# Patient Record
Sex: Female | Born: 1960 | Race: Black or African American | Hispanic: No | Marital: Married | Smoking: Never smoker
Health system: Southern US, Community
[De-identification: ages and names within clinical notes are randomized; demographics above are authoritative.]

## PROBLEM LIST (undated history)

## (undated) DIAGNOSIS — E785 Hyperlipidemia, unspecified: Secondary | ICD-10-CM

## (undated) DIAGNOSIS — I10 Essential (primary) hypertension: Secondary | ICD-10-CM

## (undated) HISTORY — DX: Essential (primary) hypertension: I10

---

## 1998-08-23 ENCOUNTER — Emergency Department (HOSPITAL_COMMUNITY): Admission: EM | Admit: 1998-08-23 | Discharge: 1998-08-23 | Payer: Self-pay | Admitting: Emergency Medicine

## 1998-08-24 ENCOUNTER — Encounter: Payer: Self-pay | Admitting: *Deleted

## 2012-12-01 ENCOUNTER — Telehealth (HOSPITAL_COMMUNITY): Payer: Self-pay | Admitting: *Deleted

## 2012-12-01 NOTE — Telephone Encounter (Signed)
Telephoned patient at home # and left message to return call to [facility] 

## 2012-12-04 ENCOUNTER — Other Ambulatory Visit: Payer: Self-pay | Admitting: Obstetrics and Gynecology

## 2012-12-04 DIAGNOSIS — Z1231 Encounter for screening mammogram for malignant neoplasm of breast: Secondary | ICD-10-CM

## 2012-12-07 ENCOUNTER — Encounter (HOSPITAL_COMMUNITY): Payer: Self-pay | Admitting: *Deleted

## 2012-12-26 ENCOUNTER — Ambulatory Visit (HOSPITAL_COMMUNITY): Payer: Self-pay | Attending: Obstetrics and Gynecology

## 2019-09-05 ENCOUNTER — Other Ambulatory Visit: Payer: Self-pay

## 2019-09-05 ENCOUNTER — Emergency Department (HOSPITAL_COMMUNITY): Payer: Self-pay

## 2019-09-05 ENCOUNTER — Inpatient Hospital Stay (HOSPITAL_COMMUNITY)
Admission: EM | Admit: 2019-09-05 | Discharge: 2019-09-09 | DRG: 683 | Disposition: A | Discharge status: Home / Self Care | Payer: Self-pay | Attending: Internal Medicine | Admitting: Internal Medicine

## 2019-09-05 ENCOUNTER — Encounter (HOSPITAL_COMMUNITY): Payer: Self-pay | Admitting: Emergency Medicine

## 2019-09-05 DIAGNOSIS — Z20828 Contact with and (suspected) exposure to other viral communicable diseases: Secondary | ICD-10-CM | POA: Diagnosis present

## 2019-09-05 DIAGNOSIS — A020 Salmonella enteritis: Secondary | ICD-10-CM | POA: Diagnosis present

## 2019-09-05 DIAGNOSIS — R739 Hyperglycemia, unspecified: Secondary | ICD-10-CM | POA: Diagnosis present

## 2019-09-05 DIAGNOSIS — R197 Diarrhea, unspecified: Secondary | ICD-10-CM

## 2019-09-05 DIAGNOSIS — E869 Volume depletion, unspecified: Secondary | ICD-10-CM

## 2019-09-05 DIAGNOSIS — A040 Enteropathogenic Escherichia coli infection: Secondary | ICD-10-CM | POA: Diagnosis present

## 2019-09-05 DIAGNOSIS — E872 Acidosis, unspecified: Secondary | ICD-10-CM

## 2019-09-05 DIAGNOSIS — E860 Dehydration: Secondary | ICD-10-CM | POA: Diagnosis present

## 2019-09-05 DIAGNOSIS — I129 Hypertensive chronic kidney disease with stage 1 through stage 4 chronic kidney disease, or unspecified chronic kidney disease: Secondary | ICD-10-CM | POA: Diagnosis present

## 2019-09-05 DIAGNOSIS — N189 Chronic kidney disease, unspecified: Secondary | ICD-10-CM | POA: Diagnosis present

## 2019-09-05 DIAGNOSIS — E876 Hypokalemia: Secondary | ICD-10-CM | POA: Diagnosis present

## 2019-09-05 DIAGNOSIS — N179 Acute kidney failure, unspecified: Principal | ICD-10-CM | POA: Diagnosis present

## 2019-09-05 LAB — URINALYSIS, ROUTINE W REFLEX MICROSCOPIC
Bilirubin Urine: NEGATIVE
Glucose, UA: NEGATIVE mg/dL
Hgb urine dipstick: NEGATIVE
Ketones, ur: NEGATIVE mg/dL
Leukocytes,Ua: NEGATIVE
Nitrite: NEGATIVE
Protein, ur: 30 mg/dL — AB
Specific Gravity, Urine: 1.013 (ref 1.005–1.030)
pH: 5 (ref 5.0–8.0)

## 2019-09-05 LAB — COMPREHENSIVE METABOLIC PANEL
ALT: 16 U/L (ref 0–44)
AST: 26 U/L (ref 15–41)
Albumin: 4.2 g/dL (ref 3.5–5.0)
Alkaline Phosphatase: 55 U/L (ref 38–126)
Anion gap: 22 — ABNORMAL HIGH (ref 5–15)
BUN: 66 mg/dL — ABNORMAL HIGH (ref 6–20)
CO2: 12 mmol/L — ABNORMAL LOW (ref 22–32)
Calcium: 8.9 mg/dL (ref 8.9–10.3)
Chloride: 101 mmol/L (ref 98–111)
Creatinine, Ser: 5 mg/dL — ABNORMAL HIGH (ref 0.44–1.00)
GFR calc Af Amer: 10 mL/min — ABNORMAL LOW (ref >60)
GFR calc non Af Amer: 9 mL/min — ABNORMAL LOW (ref >60)
Glucose, Bld: 127 mg/dL — ABNORMAL HIGH (ref 70–99)
Potassium: 2.9 mmol/L — ABNORMAL LOW (ref 3.5–5.1)
Sodium: 135 mmol/L (ref 135–145)
Total Bilirubin: 0.7 mg/dL (ref 0.3–1.2)
Total Protein: 9.2 g/dL — ABNORMAL HIGH (ref 6.5–8.1)

## 2019-09-05 LAB — PHOSPHORUS: Phosphorus: 9 mg/dL — ABNORMAL HIGH (ref 2.5–4.6)

## 2019-09-05 LAB — BLOOD GAS, VENOUS
Acid-base deficit: 11.5 mmol/L — ABNORMAL HIGH (ref 0.0–2.0)
Bicarbonate: 14.8 mmol/L — ABNORMAL LOW (ref 20.0–28.0)
O2 Saturation: 42.6 %
Patient temperature: 98.6
pCO2, Ven: 35.8 mmHg — ABNORMAL LOW (ref 44.0–60.0)
pH, Ven: 7.241 — ABNORMAL LOW (ref 7.250–7.430)

## 2019-09-05 LAB — MAGNESIUM: Magnesium: 2.3 mg/dL (ref 1.7–2.4)

## 2019-09-05 LAB — LACTIC ACID, PLASMA: Lactic Acid, Venous: 1 mmol/L (ref 0.5–1.9)

## 2019-09-05 LAB — CBC
HCT: 44.8 % (ref 36.0–46.0)
Hemoglobin: 14.8 g/dL (ref 12.0–15.0)
MCH: 25.6 pg — ABNORMAL LOW (ref 26.0–34.0)
MCHC: 33 g/dL (ref 30.0–36.0)
MCV: 77.5 fL — ABNORMAL LOW (ref 80.0–100.0)
Platelets: 253 10*3/uL (ref 150–400)
RBC: 5.78 MIL/uL — ABNORMAL HIGH (ref 3.87–5.11)
RDW: 13.8 % (ref 11.5–15.5)
WBC: 8.2 10*3/uL (ref 4.0–10.5)
nRBC: 0 % (ref 0.0–0.2)

## 2019-09-05 LAB — I-STAT BETA HCG BLOOD, ED (MC, WL, AP ONLY): I-stat hCG, quantitative: <5 m[IU]/mL (ref <5)

## 2019-09-05 LAB — POC SARS CORONAVIRUS 2 AG -  ED: SARS Coronavirus 2 Ag: NEGATIVE

## 2019-09-05 LAB — LIPASE, BLOOD: Lipase: 215 U/L — ABNORMAL HIGH (ref 11–51)

## 2019-09-05 MED ORDER — SODIUM CHLORIDE 0.9 % IV BOLUS
1000.0000 mL | Freq: Once | INTRAVENOUS | Status: AC
  Administered 2019-09-05: 1000 mL via INTRAVENOUS

## 2019-09-05 MED ORDER — POTASSIUM CHLORIDE 10 MEQ/100ML IV SOLN
10.0000 meq | INTRAVENOUS | Status: AC
  Administered 2019-09-05 – 2019-09-06 (×2): 10 meq via INTRAVENOUS
  Filled 2019-09-05 (×2): qty 100

## 2019-09-05 MED ORDER — SODIUM CHLORIDE 0.9% FLUSH: 3.0000 mL | Freq: Once | INTRAVENOUS | Status: DC

## 2019-09-05 MED ORDER — MAGNESIUM SULFATE 2 GM/50ML IV SOLN
2.0000 g | Freq: Once | INTRAVENOUS | Status: AC
  Administered 2019-09-06: 2 g via INTRAVENOUS
  Filled 2019-09-05: qty 50

## 2019-09-05 MED ORDER — LOPERAMIDE HCL 2 MG PO CAPS
4.0000 mg | ORAL_CAPSULE | Freq: Once | ORAL | Status: AC
  Administered 2019-09-05: 4 mg via ORAL
  Filled 2019-09-05: qty 2

## 2019-09-05 MED ORDER — SODIUM BICARBONATE-DEXTROSE 150-5 MEQ/L-% IV SOLN
150.0000 meq | INTRAVENOUS | Status: DC
  Administered 2019-09-06 – 2019-09-07 (×4): 150 meq via INTRAVENOUS
  Filled 2019-09-05 (×6): qty 1000

## 2019-09-05 MED ORDER — POTASSIUM CHLORIDE CRYS ER 20 MEQ PO TBCR
40.0000 meq | EXTENDED_RELEASE_TABLET | Freq: Once | ORAL | Status: AC
  Administered 2019-09-06: 40 meq via ORAL
  Filled 2019-09-05: qty 2

## 2019-09-05 NOTE — ED Provider Notes (Signed)
[facility] DEPT Provider Note   CSN: [id number] Arrival date & time: 09/05/19  2119     History   Chief Complaint Chief Complaint  Patient presents with  . Diarrhea    HPI Rebecca Huber is a 58 y.o. female.     Patient to ED with complaint of diarrhea for the past 4 days. She denies abdominal pain or cramping. No nausea, vomiting or fever. She states she has a loose, watery, nonbloody stool with any attempt at eating or drinking. No new medications, dietary changes, or recent antibiotics. She reports no change in the amount she is urinating. She feels generally weak and "drained".   The history is provided by the patient. No language interpreter was used.  Diarrhea Associated symptoms: no abdominal pain, no chills, no fever and no vomiting     History reviewed. No pertinent past medical history.  There are no active problems to display for this patient.    OB History   No obstetric history on file.      Home Medications    Prior to Admission medications   Not on File    Family History No family history on file.  Social History Social History   Tobacco Use  . Smoking status: Not on file  Substance Use Topics  . Alcohol use: Not on file  . Drug use: Not on file     Allergies   Patient has no known allergies.   Review of Systems Review of Systems  Constitutional: Negative for chills and fever.  HENT: Negative.   Respiratory: Negative.  Negative for shortness of breath.   Cardiovascular: Negative.  Negative for chest pain.  Gastrointestinal: Positive for diarrhea. Negative for abdominal pain, nausea and vomiting.  Genitourinary: Negative for decreased urine volume and dysuria.  Musculoskeletal: Negative.   Skin: Negative.   Neurological: Positive for weakness. Negative for syncope.     Physical Exam Updated Vital Signs BP (!) 175/97 (BP Location: Left Arm)   Pulse (!) 102   Temp 98.7 F (37.1 C) (Oral)    Resp 18   SpO2 98%   Physical Exam Constitutional:      Appearance: She is well-developed.  HENT:     Head: Normocephalic.     Mouth/Throat:     Mouth: Mucous membranes are dry.  Neck:     Musculoskeletal: Normal range of motion and neck supple.  Cardiovascular:     Rate and Rhythm: Normal rate and regular rhythm.  Pulmonary:     Effort: Pulmonary effort is normal.     Breath sounds: Normal breath sounds. No wheezing, rhonchi or rales.  Abdominal:     General: Bowel sounds are normal.     Palpations: Abdomen is soft.     Tenderness: There is no abdominal tenderness. There is no guarding or rebound.  Musculoskeletal: Normal range of motion.  Skin:    General: Skin is warm and dry.     Findings: No rash.  Neurological:     Mental Status: She is alert and oriented to person, place, and time.      ED Treatments / Results  Labs (all labs ordered are listed, but only abnormal results are displayed) Labs Reviewed  CBC - Abnormal; Notable for the following components:      Result Value   RBC 5.78 (*)    MCV 77.5 (*)    MCH 25.6 (*)    All other components within normal limits  URINALYSIS, ROUTINE W  REFLEX MICROSCOPIC - Abnormal; Notable for the following components:   APPearance HAZY (*)    Protein, ur 30 (*)    Bacteria, UA RARE (*)    All other components within normal limits  LIPASE, BLOOD  COMPREHENSIVE METABOLIC PANEL  I-STAT BETA HCG BLOOD, ED ([facility], [facility], [facility] ONLY)  I-STAT BETA HCG BLOOD, ED ([facility], [facility], [facility] ONLY)    EKG None  Radiology No results found.  Procedures Procedures (including critical care time)  Medications Ordered in ED Medications  sodium chloride 0.9 % bolus 1,000 mL (has no administration in time range)  loperamide (IMODIUM) capsule 4 mg (has no administration in time range)     Initial Impression / Assessment and Plan / ED Course  I have reviewed the triage vital signs and the nursing notes.  Pertinent labs & imaging results that were  available during my care of the patient were reviewed by me and considered in my medical decision making (see chart for details).        Patient to ED with complaint of diarrhea x 4 days. She reports BM with any attempt at eating or drinking. Now feeling generally weak. No pain or fever.   Labs reviewed. She has a significant metabolic acidosis, AKI with Cr 5.0, lipase of 215. Normal LFT's. There is no abdominal pain/tenderness on initial or repeat exam. No N, V. Potassium is low as well at 2.9. VBG c/w metabolic acidosis.   Unclear as to cause of metabolic derangement or elevated lipase. Will require admission for further evaluation. Per admitting MD, Mag, Phos., CT scan ordered. Patient updated on lab results and plan to admit.     Final Clinical Impressions(s) / ED Diagnoses   Final diagnoses:  None   1. Metabolic acidosis 2. Hypokalemia 3. AKI 4. Diarrhea  ED Discharge Orders    None       Elpidio Anis, PA-C 09/06/19 0010    Pricilla Loveless, MD 09/07/19 2251

## 2019-09-05 NOTE — ED Notes (Signed)
Urinalysis sent to lab with culture tube

## 2019-09-05 NOTE — ED Notes (Signed)
Date and time results received: 09/05/19 11:19 PM  (use smartphrase ".now" to insert current time)  Test: PO2 Critical Value: Less than reportable range  Name of Provider Notified: Charlann Lange, PA  Orders Received? Or Actions Taken?: none

## 2019-09-05 NOTE — H&P (Signed)
History and Physical  Rebecca Huber:[id number] DOB: 04-27-1961 DOA: 09/05/2019  Referring physician: ER provider PCP: Patient, No Pcp Per  Outpatient Specialists: None Patient coming from: Home  Chief Complaint: Diarrhea for 3 days  HPI: Patient is a 58 year old African American female with no documented past medical history.  Apparently, patient has not seen a physician for over 20 years.  Patient presents with 3-day history of severe diarrhea.  According to the patient, she had diarrhea stool every time she had an oral intake.  The stool is said to be watery and nonbloody.  Stool is possibly mucoid.  No associated fever or chills, no associated crampy abdominal pain.  No sick contacts.  Patient ate at discharge about 5 days prior to onset of symptoms, otherwise, no history of eating outside of home.  Patient lives alone.  The diarrhea has improved to some extent.  Patient reported 5 episodes of diarrhea stool today.  Patient had nausea and vomiting initially (the day of onset of symptoms) but none for now.  No headache, no neck pain, no chest pain, no shortness of breath, no URI symptoms and no urinary symptoms.  Blood pressure of 175/97 mmHg was documented in presentation.  Patient denies prior history of hypertension.  Pertinent lab work revealed potassium of 2.9, BUN of 66, serum creatinine of 5, CO2 of 12, hemoglobin of 14.8 g/dL but patient is likely hemoconcentrated, blood sugar is 127 and lactic acid is 1.  Specific gravity of 1.013 with urine protein of 30.  Hyaline casts was present.  Hospitalist team has been asked to admit patient for further assessment and management.  ED Course: On presentation to the hospital, temperature was 98.7, blood pressure 175/97, heart rate of 102, respiratory rate of 18 and O2 sat of 98%.  Pertinent labs as documented above.  Patient has been volume resuscitated with 1 L of normal saline.  Potassium is being repleted.  Basic lab work has been sent.   Hospitalist team has been asked to admit patient for further assessment and management.  Pertinent labs: Please see above.  Lipase is 215.   Imaging: independently reviewed.  CT scan of the abdomen and pelvis without contrast revealed normal pancreas, findings suggestive of enteritis, diverticulosis without diverticulitis and hypodense lesion within the posterior aspect of the left lobe of the liver suggestive of possible cyst versus hemangioma.  Review of Systems:  Negative for fever, visual changes, sore throat, rash, new muscle aches, chest pain, SOB, dysuria, bleeding.  History reviewed. No pertinent past medical history.    has no history on file for tobacco, alcohol, and drug.  No Known Allergies  No family history on file.   Prior to Admission medications   Not on File    Physical Exam: Vitals:   09/05/19 2126  BP: (!) 175/97  Pulse: (!) 102  Resp: 18  Temp: 98.7 F (37.1 C)  TempSrc: Oral  SpO2: 98%   Constitutional:  . Appears calm and comfortable. Eyes:  . No pallor. No jaundice.  ENMT:  . external ears, nose appear normal.  Dry buccal mucosa. Neck:  . Neck is supple. No JVD Respiratory:  . CTA bilaterally, no w/r/r.  . Respiratory effort normal. No retractions or accessory muscle use Cardiovascular:  . S1S2 . No LE extremity edema   Abdomen:  . Abdomen is obese, soft and non tender. Organs are difficult to assess. Neurologic:  . Awake and alert. . Moves all limbs.  Wt Readings from Last 3  Encounters:  No data found for Wt    I have personally reviewed following labs and imaging studies  Labs on Admission:  CBC: Recent Labs  Lab 09/05/19 2142  WBC 8.2  HGB 14.8  HCT 44.8  MCV 77.5*  PLT 253   Basic Metabolic Panel: Recent Labs  Lab 09/05/19 2142  NA 135  K 2.9*  CL 101  CO2 12*  GLUCOSE 127*  BUN 66*  CREATININE 5.00*  CALCIUM 8.9   Liver Function Tests: Recent Labs  Lab 09/05/19 2142  AST 26  ALT 16  ALKPHOS 55   BILITOT 0.7  PROT 9.2*  ALBUMIN 4.2   Recent Labs  Lab 09/05/19 2142  LIPASE 215*   No results for input(s): AMMONIA in the last 168 hours. Coagulation Profile: No results for input(s): INR, PROTIME in the last 168 hours. Cardiac Enzymes: No results for input(s): CKTOTAL, CKMB, CKMBINDEX, TROPONINI in the last 168 hours. BNP (last 3 results) No results for input(s): PROBNP in the last 8760 hours. HbA1C: No results for input(s): HGBA1C in the last 72 hours. CBG: No results for input(s): GLUCAP in the last 168 hours. Lipid Profile: No results for input(s): CHOL, HDL, LDLCALC, TRIG, CHOLHDL, LDLDIRECT in the last 72 hours. Thyroid Function Tests: No results for input(s): TSH, T4TOTAL, FREET4, T3FREE, THYROIDAB in the last 72 hours. Anemia Panel: No results for input(s): VITAMINB12, FOLATE, FERRITIN, TIBC, IRON, RETICCTPCT in the last 72 hours. Urine analysis:    Component Value Date/Time   COLORURINE YELLOW 09/05/2019 2142   APPEARANCEUR HAZY (A) 09/05/2019 2142   LABSPEC 1.013 09/05/2019 2142   PHURINE 5.0 09/05/2019 2142   GLUCOSEU NEGATIVE 09/05/2019 2142   HGBUR NEGATIVE 09/05/2019 2142   BILIRUBINUR NEGATIVE 09/05/2019 2142   KETONESUR NEGATIVE 09/05/2019 2142   PROTEINUR 30 (A) 09/05/2019 2142   NITRITE NEGATIVE 09/05/2019 2142   LEUKOCYTESUR NEGATIVE 09/05/2019 2142   Sepsis Labs: @LABRCNTIP (procalcitonin:4,lacticidven:4) )No results found for this or any previous visit (from the past 240 hour(s)).    Radiological Exams on Admission: No results found.   Active Problems:   AKI (acute kidney injury) (HCC)   Assessment/Plan Acute kidney injury versus acute kidney injury on chronic kidney disease: -Suspect patient has previously on chronic kidney disease, stage unknown. -Acute kidney component is most likely prerenal. -Normal hemoglobin noted may be due to hemoconcentration. -UA reveals some protein. -Check urine sodium, urine protein and creatinine.  -Check ANA, C3 and C4 -Renal ultrasound -Low threshold to check 25 hydroxy vitamin D level and intact PTH (patient has not seen a physician in 20 years, and may not follow up readily) -Aggressive hydration -Monitor renal function -Monitor correct abnormal electrolytes -Low threshold to consult nephrology team -Dose all medications assuming GFR of less than 5 mils per minute -Further management depend on hospital course  Acute severe gastroenteritis: -Etiology unclear -Suspect non-bacterial -Stool analysis -We will hold antibiotics for now -Supportive care  Volume depletion: -Hydrate patient -Strict I's and O's  Elevated blood pressure: -Continue to monitor closely -Patient may have 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  Have a low threshold to consult nephrology Admission status: Inpatient  Time spent: 65 minutes  Jacqlyn KraussSylvester  Marthenia Rolling, MD  [facility] Pager #: [phone] 7PM-7AM contact night coverage as above  09/05/2019, 11:55 PM

## 2019-09-05 NOTE — ED Triage Notes (Signed)
Pt arriving POV with complaint of possible dehydration due to having diarrhea intermittently for the last 3 days. Pt reports that every time she eats, it "goes right through". Denies N/V.

## 2019-09-06 ENCOUNTER — Inpatient Hospital Stay (HOSPITAL_COMMUNITY): Payer: Self-pay

## 2019-09-06 LAB — COMPREHENSIVE METABOLIC PANEL
ALT: 13 U/L (ref 0–44)
AST: 24 U/L (ref 15–41)
Albumin: 3.5 g/dL (ref 3.5–5.0)
Alkaline Phosphatase: 46 U/L (ref 38–126)
Anion gap: 17 — ABNORMAL HIGH (ref 5–15)
BUN: 64 mg/dL — ABNORMAL HIGH (ref 6–20)
CO2: 14 mmol/L — ABNORMAL LOW (ref 22–32)
Calcium: 8 mg/dL — ABNORMAL LOW (ref 8.9–10.3)
Chloride: 105 mmol/L (ref 98–111)
Creatinine, Ser: 4.23 mg/dL — ABNORMAL HIGH (ref 0.44–1.00)
GFR calc Af Amer: 12 mL/min — ABNORMAL LOW (ref >60)
GFR calc non Af Amer: 11 mL/min — ABNORMAL LOW (ref >60)
Glucose, Bld: 105 mg/dL — ABNORMAL HIGH (ref 70–99)
Potassium: 3 mmol/L — ABNORMAL LOW (ref 3.5–5.1)
Sodium: 136 mmol/L (ref 135–145)
Total Bilirubin: 0.4 mg/dL (ref 0.3–1.2)
Total Protein: 7.5 g/dL (ref 6.5–8.1)

## 2019-09-06 LAB — BASIC METABOLIC PANEL
Anion gap: 18 — ABNORMAL HIGH (ref 5–15)
BUN: 65 mg/dL — ABNORMAL HIGH (ref 6–20)
CO2: 13 mmol/L — ABNORMAL LOW (ref 22–32)
Calcium: 8 mg/dL — ABNORMAL LOW (ref 8.9–10.3)
Chloride: 104 mmol/L (ref 98–111)
Creatinine, Ser: 4.59 mg/dL — ABNORMAL HIGH (ref 0.44–1.00)
GFR calc Af Amer: 11 mL/min — ABNORMAL LOW (ref >60)
GFR calc non Af Amer: 10 mL/min — ABNORMAL LOW (ref >60)
Glucose, Bld: 107 mg/dL — ABNORMAL HIGH (ref 70–99)
Potassium: 2.7 mmol/L — CL (ref 3.5–5.1)
Sodium: 135 mmol/L (ref 135–145)

## 2019-09-06 LAB — BLOOD GAS, ARTERIAL
Acid-base deficit: 13.9 mmol/L — ABNORMAL HIGH (ref 0.0–2.0)
Bicarbonate: 11.2 mmol/L — ABNORMAL LOW (ref 20.0–28.0)
O2 Saturation: 97.2 %
Patient temperature: 98.6
pCO2 arterial: 24.6 mmHg — ABNORMAL LOW (ref 32.0–48.0)
pH, Arterial: 7.28 — ABNORMAL LOW (ref 7.350–7.450)
pO2, Arterial: 111 mmHg — ABNORMAL HIGH (ref 83.0–108.0)

## 2019-09-06 LAB — CBC
HCT: 38.8 % (ref 36.0–46.0)
Hemoglobin: 12.9 g/dL (ref 12.0–15.0)
MCH: 25.7 pg — ABNORMAL LOW (ref 26.0–34.0)
MCHC: 33.2 g/dL (ref 30.0–36.0)
MCV: 77.3 fL — ABNORMAL LOW (ref 80.0–100.0)
Platelets: 204 10*3/uL (ref 150–400)
RBC: 5.02 MIL/uL (ref 3.87–5.11)
RDW: 13.8 % (ref 11.5–15.5)
WBC: 6.6 10*3/uL (ref 4.0–10.5)
nRBC: 0 % (ref 0.0–0.2)

## 2019-09-06 LAB — SARS CORONAVIRUS 2 (TAT 6-24 HRS): SARS Coronavirus 2: NEGATIVE

## 2019-09-06 LAB — PROTEIN / CREATININE RATIO, URINE
Creatinine, Urine: 190.92 mg/dL
Protein Creatinine Ratio: 0.45 mg/mg{Cre} — ABNORMAL HIGH (ref 0.00–0.15)
Total Protein, Urine: 85 mg/dL

## 2019-09-06 LAB — HEMOGLOBIN A1C
Hgb A1c MFr Bld: 6 % — ABNORMAL HIGH (ref 4.8–5.6)
Mean Plasma Glucose: 125.5 mg/dL

## 2019-09-06 LAB — VITAMIN D 25 HYDROXY (VIT D DEFICIENCY, FRACTURES): Vit D, 25-Hydroxy: 4.93 ng/mL — ABNORMAL LOW (ref 30–100)

## 2019-09-06 LAB — LIPASE, BLOOD: Lipase: 332 U/L — ABNORMAL HIGH (ref 11–51)

## 2019-09-06 LAB — SODIUM, URINE, RANDOM: Sodium, Ur: 12 mmol/L

## 2019-09-06 LAB — HIV ANTIBODY (ROUTINE TESTING W REFLEX): HIV Screen 4th Generation wRfx: NONREACTIVE

## 2019-09-06 LAB — TSH: TSH: 2.589 u[IU]/mL (ref 0.350–4.500)

## 2019-09-06 MED ORDER — BOOST / RESOURCE BREEZE PO LIQD CUSTOM
1.0000 | Freq: Three times a day (TID) | ORAL | Status: DC
  Administered 2019-09-06 (×2): 1 via ORAL
  Administered 2019-09-06: 237 mL via ORAL
  Administered 2019-09-07: 1 via ORAL
  Administered 2019-09-07: 237 mL via ORAL
  Administered 2019-09-07 – 2019-09-09 (×5): 1 via ORAL

## 2019-09-06 MED ORDER — ENSURE ENLIVE PO LIQD: 237.0000 mL | Freq: Two times a day (BID) | ORAL | Status: DC

## 2019-09-06 MED ORDER — HEPARIN SODIUM (PORCINE) 5000 UNIT/ML IJ SOLN
5000.0000 [IU] | Freq: Three times a day (TID) | INTRAMUSCULAR | Status: DC
  Administered 2019-09-06 – 2019-09-09 (×10): 5000 [IU] via SUBCUTANEOUS
  Filled 2019-09-06 (×10): qty 1

## 2019-09-06 MED ORDER — HYDRALAZINE HCL 25 MG PO TABS
25.0000 mg | ORAL_TABLET | Freq: Four times a day (QID) | ORAL | Status: DC | PRN
  Administered 2019-09-08 – 2019-09-09 (×2): 25 mg via ORAL
  Filled 2019-09-06 (×2): qty 1

## 2019-09-06 MED ORDER — POTASSIUM CHLORIDE CRYS ER 20 MEQ PO TBCR
40.0000 meq | EXTENDED_RELEASE_TABLET | Freq: Two times a day (BID) | ORAL | Status: AC
  Administered 2019-09-06 – 2019-09-07 (×4): 40 meq via ORAL
  Filled 2019-09-06 (×4): qty 2

## 2019-09-06 NOTE — ED Notes (Signed)
Pt transported to CT ?

## 2019-09-06 NOTE — ED Notes (Signed)
US at bedside

## 2019-09-06 NOTE — ED Notes (Signed)
ED TO INPATIENT HANDOFF REPORT  Name/Age/Gender Rebecca Huber 58 y.o. female  Code Status    Code Status Orders  (From admission, onward)         Start     Ordered   09/06/19 0219  Full code  Continuous     09/06/19 0218        Code Status History    This patient has a current code status but no historical code status.   Advance Care Planning Activity      Home/SNF/Other Home  Chief Complaint dehydrated  Level of Care/Admitting Diagnosis ED Disposition    ED Disposition Condition [facility]: [place] [100102]  Level of Care: Telemetry [5]  Admit to tele based on following criteria: Complex arrhythmia (Bradycardia/Tachycardia)  Covid Evaluation: Asymptomatic Screening Protocol (No Symptoms)  Diagnosis: AKI (acute kidney injury) ([place]) [828003]  Admitting Physician: Bonnell Public [[id number]]  Attending Physician: Dana Allan I [[id number]]  Estimated length of stay: 3 - 4 days  Certification:: I certify this patient will need inpatient services for at least 2 midnights  PT Class (Do Not Modify): Inpatient [101]  PT Acc Code (Do Not Modify): Private [1]       Medical History History reviewed. No pertinent past medical history.  Allergies No Known Allergies  IV Location/Drains/Wounds Patient Lines/Drains/Airways Status   Active Line/Drains/Airways    Name:   Placement date:   Placement time:   Site:   Days:   Peripheral IV 09/05/19 Right Antecubital   09/05/19    2248    Antecubital   1   Peripheral IV 09/06/19 Left Forearm   09/06/19    0059    Forearm   less than 1          Labs/Imaging Results for orders placed or performed during the hospital encounter of 09/05/19 (from the past 48 hour(s))  Lipase, blood     Status: Abnormal   Collection Time: 09/05/19  9:42 PM  Result Value Ref Range   Lipase 215 (H) 11 - 51 U/L    Comment: Performed at [facility], [place] [address],  [place], [place] [address]  Comprehensive metabolic panel     Status: Abnormal   Collection Time: 09/05/19  9:42 PM  Result Value Ref Range   Sodium 135 135 - 145 mmol/L   Potassium 2.9 (L) 3.5 - 5.1 mmol/L   Chloride 101 98 - 111 mmol/L   CO2 12 (L) 22 - 32 mmol/L   Glucose, Bld 127 (H) 70 - 99 mg/dL   BUN 66 (H) 6 - 20 mg/dL   Creatinine, Ser 5.00 (H) 0.44 - 1.00 mg/dL   Calcium 8.9 8.9 - 10.3 mg/dL   Total Protein 9.2 (H) 6.5 - 8.1 g/dL   Albumin 4.2 3.5 - 5.0 g/dL   AST 26 15 - 41 U/L   ALT 16 0 - 44 U/L   Alkaline Phosphatase 55 38 - 126 U/L   Total Bilirubin 0.7 0.3 - 1.2 mg/dL   GFR calc non Af Amer 9 (L) >60 mL/min   GFR calc Af Amer 10 (L) >60 mL/min   Anion gap 22 (H) 5 - 15    Comment: Performed at [facility], [place] [address], [place], [place] [address]  CBC     Status: Abnormal   Collection Time: 09/05/19  9:42 PM  Result Value Ref Range   WBC 8.2 4.0 - 10.5 K/uL  RBC 5.78 (H) 3.87 - 5.11 MIL/uL   Hemoglobin 14.8 12.0 - 15.0 g/dL   HCT 44.8 36.0 - 46.0 %   MCV 77.5 (L) 80.0 - 100.0 fL   MCH 25.6 (L) 26.0 - 34.0 pg   MCHC 33.0 30.0 - 36.0 g/dL   RDW 13.8 11.5 - 15.5 %   Platelets 253 150 - 400 K/uL   nRBC 0.0 0.0 - 0.2 %    Comment: Performed at [facility], [place] [address] [place], [place] [address]  Urinalysis, Routine w reflex microscopic     Status: Abnormal   Collection Time: 09/05/19  9:42 PM  Result Value Ref Range   Color, Urine YELLOW YELLOW   APPearance HAZY (A) CLEAR   Specific Gravity, Urine 1.013 1.005 - 1.030   pH 5.0 5.0 - 8.0   Glucose, UA NEGATIVE NEGATIVE mg/dL   Hgb urine dipstick NEGATIVE NEGATIVE   Bilirubin Urine NEGATIVE NEGATIVE   Ketones, ur NEGATIVE NEGATIVE mg/dL   Protein, ur 30 (A) NEGATIVE mg/dL   Nitrite NEGATIVE NEGATIVE   Leukocytes,Ua NEGATIVE NEGATIVE   RBC / HPF 0-5 0 - 5 RBC/hpf   WBC, UA 6-10 0 - 5 WBC/hpf   Bacteria, UA RARE (A) NONE SEEN   Squamous Epithelial / LPF 0-5 0 - 5    Mucus PRESENT    Hyaline Casts, UA PRESENT     Comment: Performed at [facility], [place] [address], [place], [place] [address]  Magnesium     Status: None   Collection Time: 09/05/19  9:42 PM  Result Value Ref Range   Magnesium 2.3 1.7 - 2.4 mg/dL    Comment: Performed at [facility], [place] [address] [place], [place] [address]  Phosphorus     Status: Abnormal   Collection Time: 09/05/19  9:42 PM  Result Value Ref Range   Phosphorus 9.0 (H) 2.5 - 4.6 mg/dL    Comment: Performed at [facility], [place] [address], [place], [place] [address]  I-Stat beta hCG blood, ED     Status: None   Collection Time: 09/05/19  9:48 PM  Result Value Ref Range   I-stat hCG, quantitative <5.0 <5 mIU/mL   Comment 3            Comment:   GEST. AGE      CONC.  (mIU/mL)   <=1 WEEK        5 - 50     2 WEEKS       50 - 500     3 WEEKS       100 - 10,000     4 WEEKS     1,000 - 30,000        FEMALE AND NON-PREGNANT FEMALE:     LESS THAN 5 mIU/mL   Blood gas, venous (at [facility] and [facility], not at [facility])     Status: Abnormal   Collection Time: 09/05/19 11:04 PM  Result Value Ref Range   pH, Ven 7.241 (L) 7.250 - 7.430   pCO2, Ven 35.8 (L) 44.0 - 60.0 mmHg   pO2, Ven BELOW REPORTABLE RANGE 32.0 - 45.0 mmHg    Comment: CRITICAL RESULT CALLED TO, READ BACK BY AND VERIFIED WITH: AILEEN HODGES @ 2317 ON 09/05/2019 C VARNER    Bicarbonate 14.8 (L) 20.0 - 28.0 mmol/L   Acid-base deficit 11.5 (H) 0.0 - 2.0 mmol/L   O2 Saturation 42.6 %   Patient temperature 98.6  Comment: Performed at [facility], [place] [address], [place], [place] [address]  Lactic acid, plasma     Status: None   Collection Time: 09/05/19 11:04 PM  Result Value Ref Range   Lactic Acid, Venous 1.0 0.5 - 1.9 mmol/L    Comment: Performed at [facility], [place] [address], [place], [place] [address]  Basic metabolic panel     Status: Abnormal   Collection Time:  09/05/19 11:32 PM  Result Value Ref Range   Sodium 135 135 - 145 mmol/L   Potassium 2.7 (LL) 3.5 - 5.1 mmol/L    Comment: CRITICAL RESULT CALLED TO, READ BACK BY AND VERIFIED WITH: RN BROOKS B AT 0213 CRUICKSHANK A    Chloride 104 98 - 111 mmol/L   CO2 13 (L) 22 - 32 mmol/L   Glucose, Bld 107 (H) 70 - 99 mg/dL   BUN 65 (H) 6 - 20 mg/dL   Creatinine, Ser 4.59 (H) 0.44 - 1.00 mg/dL   Calcium 8.0 (L) 8.9 - 10.3 mg/dL   GFR calc non Af Amer 10 (L) >60 mL/min   GFR calc Af Amer 11 (L) >60 mL/min   Anion gap 18 (H) 5 - 15    Comment: Performed at [facility], [place] [address], [place], [place] [address]  Blood gas, arterial     Status: Abnormal   Collection Time: 09/05/19 11:34 PM  Result Value Ref Range   pH, Arterial 7.280 (L) 7.350 - 7.450   pCO2 arterial 24.6 (L) 32.0 - 48.0 mmHg   pO2, Arterial 111 (H) 83.0 - 108.0 mmHg   Bicarbonate 11.2 (L) 20.0 - 28.0 mmol/L   Acid-base deficit 13.9 (H) 0.0 - 2.0 mmol/L   O2 Saturation 97.2 %   Patient temperature 98.6    Allens test (pass/fail) PASS PASS    Comment: Performed at [facility], [place] [address] [place],  [address]  POC SARS Coronavirus 2 Ag-ED - Nasal Swab (BD Veritor Kit)     Status: None   Collection Time: 09/05/19 11:36 PM  Result Value Ref Range   SARS Coronavirus 2 Ag NEGATIVE NEGATIVE    Comment: (NOTE) SARS-CoV-2 antigen NOT DETECTED.  Negative results are presumptive.  Negative results do not preclude SARS-CoV-2 infection and should not be used as the sole basis for treatment or other patient management decisions, including infection  control decisions, particularly in the presence of clinical signs and  symptoms consistent with COVID-19, or in those who have been in contact with the virus.  Negative results must be combined with clinical observations, patient history, and epidemiological information. The expected result is Negative. Fact Sheet for Patients:  PodPark.tn Fact Sheet for Healthcare Providers: GiftContent.is This test is not yet approved or cleared by the [place] FDA and  has been authorized for detection and/or diagnosis of SARS-CoV-2 by FDA under an Emergency Use Authorization (EUA).  This EUA will remain in effect (meaning this test can be used) for the duration of  the COVID-19 de claration under Section 564(b)(1) of the Act, 21 U.S.C. section 360bbb-3(b)(1), unless the authorization is terminated or revoked sooner.    Ct Abdomen Pelvis Wo Contrast  Result Date: 09/06/2019 CLINICAL DATA:  Elevated lipase and diarrhea EXAM: CT ABDOMEN AND PELVIS WITHOUT CONTRAST TECHNIQUE: Multidetector CT imaging of the abdomen and pelvis was performed following the standard protocol without IV contrast. COMPARISON:  None. FINDINGS: Lower chest: No acute abnormality. Hepatobiliary: Gallbladder is within normal limits.  The liver shows a rounded hypodensity within the posterior aspect of the lateral segment of the left lobe of the liver measuring 2.7 cm. This likely represents a cyst but indeterminate on this exam. Nonemergent ultrasound may be helpful for further evaluation. Pancreas: Unremarkable. No pancreatic ductal dilatation or surrounding inflammatory changes. Spleen: Normal in size without focal abnormality. Adrenals/Urinary Tract: The adrenal glands are within normal limits. Kidneys are well visualized bilaterally. No renal calculi or obstructive changes are seen. The bladder is partially distended. Stomach/Bowel: Scattered diverticular change of the colon is noted. No evidence of diverticulitis is seen. The appendix is within normal limits. Small bowel shows no obstructive process. The stomach is within normal limits. Vascular/Lymphatic: No focal vascular abnormality is seen. Scattered small lymph nodes are noted in the small bowel mesentery with increased haziness of the mesenteric  fat. This may be related to an underlying enteritis. Reproductive: Uterus and bilateral adnexa are unremarkable. Other: No abdominal wall hernia or abnormality. No abdominopelvic ascites. Musculoskeletal: No acute or significant osseous findings. IMPRESSION: Scattered small lymph nodes within the small bowel mesentery with increased haziness of the mesenteric fat suspicious for underlying enteritis. No obstructive changes are seen. Diverticulosis without diverticulitis. Hypodense lesion within the posterior aspect of the left lobe of the liver. This may represent a cyst but hemangioma would deserve consideration as well. Nonemergent ultrasound may be helpful for further clarification. Electronically Signed   By: Inez Catalina M.D.   On: 09/06/2019 00:15    Pending Labs Unresulted Labs (From admission, onward)    Start     Ordered   09/06/19 0500  Comprehensive metabolic panel  Tomorrow morning,   R     09/06/19 0218   09/06/19 0500  CBC  Tomorrow morning,   R     09/06/19 0218   09/06/19 0500  Lipase, blood  Tomorrow morning,   R     09/06/19 0218   09/06/19 0219  HIV Antibody (routine testing w rflx)  (HIV Antibody (Routine testing w reflex) panel)  Once,   STAT     09/06/19 0218   09/06/19 0219  CBC  (heparin)  Once,   STAT    Comments: Baseline for heparin therapy IF NOT ALREADY DRAWN.  Notify MD if PLT < 100 K.    09/06/19 0218   09/06/19 0219  Creatinine, serum  (heparin)  Once,   STAT    Comments: Baseline for heparin therapy IF NOT ALREADY DRAWN.    09/06/19 0218   09/06/19 0219  TSH  Once,   STAT     09/06/19 0218   09/06/19 0219  Sodium, urine, random  Once,   STAT     09/06/19 0218   09/06/19 0219  Protein / creatinine ratio, urine  Once,   STAT     09/06/19 0218   09/06/19 0219  Antinuclear Antibodies, IFA  Once,   STAT     09/06/19 0218   09/06/19 0219  C3 complement  Once,   STAT     09/06/19 0218   09/06/19 0219  C4 complement  Once,   STAT     09/06/19 0218   09/06/19  0219  Lactoferrin, Fecal,Qualitative  Once,   STAT     09/06/19 0218   09/06/19 0219  Gastrointestinal Panel by PCR , Stool  (Gastrointestinal Panel by PCR, Stool                                                                                                                                                     *  Does Not include CLOSTRIDIUM DIFFICILE testing.**If CDIFF testing is needed, select the C Difficile Quick Screen w PCR reflex order below)  Once,   STAT     09/06/19 0218   09/06/19 0219  C difficile quick scan w PCR reflex  (C Difficile quick screen w PCR reflex panel)  Once, for 24 hours,   STAT     09/06/19 0218   09/06/19 0219  OVA + PARASITE EXAM  Once,   STAT     09/06/19 0218   09/06/19 0219  Hemoglobin A1c  Once,   STAT     09/06/19 0218   09/06/19 0114  SARS CORONAVIRUS 2 (TAT 6-24 HRS) Nasopharyngeal Nasopharyngeal Swab  (Asymptomatic/Tier 3)  Once,   STAT    Question Answer Comment  Is this test for diagnosis or screening Screening   Symptomatic for COVID-19 as defined by CDC No   Hospitalized for COVID-19 No   Admitted to ICU for COVID-19 No   Previously tested for COVID-19 Yes   Resident in a congregate (group) care setting No   Employed in healthcare setting No   Pregnant No      09/06/19 0113   09/06/19 0041  VITAMIN D 25 Hydroxy (Vit-D Deficiency, Fractures)  Once,   STAT     09/06/19 0040   09/06/19 0041  PTH, intact and calcium  Once,   STAT     09/06/19 0040   Unscheduled  Occult blood card to lab, stool  As needed,   R     09/06/19 0218          Vitals/Pain Today's Vitals   09/05/19 2126 09/05/19 2130 09/06/19 0125  BP: (!) 175/97  (!) 184/67  Pulse: (!) 102  88  Resp: 18  17  Temp: 98.7 F (37.1 C)    TempSrc: Oral    SpO2: 98%  100%  PainSc:  0-No pain     Isolation Precautions Enteric precautions (UV disinfection)  Medications Medications  sodium bicarbonate 150 mEq in dextrose 5% 1000 mL infusion (150 mEq Intravenous New Bag/Given  09/06/19 0105)  heparin injection 5,000 Units (has no administration in time range)  hydrALAZINE (APRESOLINE) tablet 25 mg (has no administration in time range)  sodium chloride 0.9 % bolus 1,000 mL (0 mLs Intravenous Stopped 09/06/19 0045)  loperamide (IMODIUM) capsule 4 mg (4 mg Oral Given 09/05/19 2249)  potassium chloride 10 mEq in 100 mL IVPB (0 mEq Intravenous Stopped 09/06/19 0212)  potassium chloride SA (KLOR-CON) CR tablet 40 mEq (40 mEq Oral Given 09/06/19 0102)  magnesium sulfate IVPB 2 g 50 mL (0 g Intravenous Stopped 09/06/19 0212)    Mobility walks

## 2019-09-06 NOTE — Progress Notes (Signed)
ABG obtained on room air. Sample sent to lab for analysis.

## 2019-09-06 NOTE — Progress Notes (Signed)
[facility] APP - Progress Note  MASHA ORBACH GDJ:[id number] DOB: Jul 12, 1961 DOA: 09/05/2019 PCP: Patient, No Pcp Per   Brief narrative: Patient is a 58 year old African American female with no documented past medical history.  Apparently, patient has not seen a physician for over 20 years.  Patient presents with 3-day history of severe diarrhea.  According to the patient, she had diarrhea stool every time she had an oral intake.  The stool is said to be watery and nonbloody.  Stool is possibly mucoid.  No associated fever or chills, no associated crampy abdominal pain.  No sick contacts.  Patient ate at discharge about 5 days prior to onset of symptoms, otherwise, no history of eating outside of home.  Patient lives alone.  The diarrhea has improved to some extent.  Patient reported 5 episodes of diarrhea stool today.  Patient had nausea and vomiting initially (the day of onset of symptoms) but none for now.  No headache, no neck pain, no chest pain, no shortness of breath, no URI symptoms and no urinary symptoms.  Blood pressure of 175/97 mmHg was documented in presentation.  Patient denies prior history of hypertension.  Pertinent lab work revealed potassium of 2.9, BUN of 66, serum creatinine of 5, CO2 of 12, hemoglobin of 14.8 g/dL but patient is likely hemoconcentrated, blood sugar is 127 and lactic acid is 1.  Specific gravity of 1.013 with urine protein of 30.  Hyaline casts was present.  Hospitalist team has been asked to admit patient for further assessment and management.  ED Course: On presentation to the hospital, temperature was 98.7, blood pressure 175/97, heart rate of 102, respiratory rate of 18 and O2 sat of 98%.  Pertinent labs as documented above.  Patient has been volume resuscitated with 1 L of normal saline.  Potassium is being repleted.  Basic lab work has been sent.  Hospitalist team has been asked to admit patient for further assessment and management.  Subjective: States is  still having poor appetite but has tolerated clear liquids and electrolyte shakes.  No nausea.  No abdominal pain but continues to have a small amount of loose/diarrheal stools  Assessment/Plan: Active Problems:   AKI (acute kidney injury) (HCC) -Baseline creatinine unknown-patient with a creatinine of 5.00 with a BUN of 66 -Creatinine has decreased to 4.23 with a decreasing BUN of 64 since arrival post hydration -Associated mild metabolic acidemia as evidenced by initial elevated anion gap with low serum CO2 therefore continue bicarbonate infusion at 125 cc/h for now -UA reveals some protein -renal ultrasound unremarkable doubt evidence of medical renal disease -Urine sodium 12 which is not unexpected given suspected severe hypovolemia (serum sodium normal), urine protein 85 with elevated protein/creatinine ratio of 0.45. -Check ANA, C3 and C4 pending although renal ultrasound is unremarkable -Continue IV fluid hydration and encourage oral rehydration-monitor strict intake and output -Low threshold to consult nephrology team -Dose all medications assuming GFR of less than 5 mils per minute    Acute gastroenteritis -Suspect viral gastroenteritis as etiology -C. difficile PCR pending collection as is fecal lactoferrin, GI pathogen panel and stool for O&P. -Could be atypical presentation for Covid-patient without myalgias and no fevers-point-of-care Covid PCR negative-likely will need to retest DEXA suspicion remains high -Continue supportive care and advance diet slowly as tolerated    Acute dehydration -Hydrate patient as above -Strict I's and O's    Acute hypokalemia -secondary to GI loss -IV and oral repletion -Check magnesium and phosphorus -Further  management depend on hospital course    Elevated blood pressure without diagnosis of hypertension -Suspect secondary to appropriate response to dehydration and low renal perfusion -Continue to monitor blood pressure and if necessary  institute antihypertensive medication prior to discharge    Acute hyperglycemia -HbA1c pending -UA without glycosuria ketones    DVT prophylaxis: SQ heparin  Code Status:  Full  Family Communication:  Patient only  Disposition Plan/Expected LOS: Inpatient, patient presents with diarrhea with significant elevation in serum creatinine with no apparent past medical history of renal disease.  Etiology seems to be related to acute viral illness.  Plan is to continue IV hydration and follow labs as well as intake and output closely.  Patient is also requiring IV bicarbonate infusion to assist with metabolic acidemia from renal failure.  Consultants: None  Procedures: Renal ultrasound: Unremarkable renal ultrasound  Cultures: Stool for fecal lactoferrin pending collection GI pathogen panel pending Stool for O&P pending Stool for C. difficile PCR pending  Antibiotics: None   Objective: Blood pressure (!) 160/82, pulse 87, temperature 98.2 F (36.8 C), temperature source Oral, resp. rate 20, height 5\' 7"  (1.702 m), weight 84.1 kg, SpO2 100 %.  Intake/Output Summary (Last 24 hours) at 09/06/2019 1057 Last data filed at 09/06/2019 0700 Gross per 24 hour  Intake 696.75 ml  Output 0 ml  Net 696.75 ml     Exam: Gen: No acute respiratory distress, nontoxic-appearing, alert HEENT: Oral mucous membranes pink and dry Chest: Clear to auscultation bilaterally without wheezes, rhonchi or crackles, room air Cardiac: Regular rate and rhythm, S1-S2, no rubs murmurs or gallops, no peripheral edema, no JVD; sodium bicarbonate drip at 125 cc/h Abdomen: Soft nontender nondistended without obvious hepatosplenomegaly, no ascites Extremities: Symmetrical in appearance without cyanosis, clubbing or effusion  Scheduled Meds:  Scheduled Meds: . feeding supplement  1 Container Oral TID BM  . heparin  5,000 Units Subcutaneous Q8H  . potassium chloride  40 mEq Oral BID   Continuous  Infusions: . sodium bicarbonate 150 mEq in dextrose 5% 1000 mL 125 mL/hr at 09/06/19 0253    Data Reviewed: Basic Metabolic Panel: Recent Labs  Lab 09/05/19 2142 09/05/19 2332 09/06/19 0530  NA 135 135 136  K 2.9* 2.7* 3.0*  CL 101 104 105  CO2 12* 13* 14*  GLUCOSE 127* 107* 105*  BUN 66* 65* 64*  CREATININE 5.00* 4.59* 4.23*  CALCIUM 8.9 8.0* 8.0*  MG 2.3  --   --   PHOS 9.0*  --   --    Liver Function Tests: Recent Labs  Lab 09/05/19 2142 09/06/19 0530  AST 26 24  ALT 16 13  ALKPHOS 55 46  BILITOT 0.7 0.4  PROT 9.2* 7.5  ALBUMIN 4.2 3.5   Recent Labs  Lab 09/05/19 2142 09/06/19 0530  LIPASE 215* 332*   No results for input(s): AMMONIA in the last 168 hours. CBC: Recent Labs  Lab 09/05/19 2142 09/06/19 0827  WBC 8.2 6.6  HGB 14.8 12.9  HCT 44.8 38.8  MCV 77.5* 77.3*  PLT 253 204   Cardiac Enzymes: No results for input(s): CKTOTAL, CKMB, CKMBINDEX, TROPONINI in the last 168 hours. BNP (last 3 results) No results for input(s): BNP in the last 8760 hours.  ProBNP (last 3 results) No results for input(s): PROBNP in the last 8760 hours.  CBG: No results for input(s): GLUCAP in the last 168 hours.  No results found for this or any previous visit (from the past 240 hour(s)).  Studies:  Recent x-ray studies have been reviewed in detail by the Attending Physician  Time spent :      Erin Hearing, [facility] Office  [phone] Pager [phone]   **If unable to reach the above provider after paging please contact the [facility] @ [phone]  On-Call/Text Page:      [email or web address]      password [email or web address]  If 7PM-7AM, please contact night-coverage [email or web address] Password [email or web address] 09/06/2019, 10:57 AM   LOS: 1 day

## 2019-09-06 NOTE — ED Notes (Signed)
Pt ambulated to the bathroom without assistance. Gait steady  

## 2019-09-06 NOTE — Progress Notes (Signed)
Rebecca Huber, patient's son is designated Counselling psychologist.   Patient approved to be visitor.    Added to patient chart.   [place], Therapist, sports

## 2019-09-07 DIAGNOSIS — A020 Salmonella enteritis: Secondary | ICD-10-CM | POA: Diagnosis present

## 2019-09-07 DIAGNOSIS — A040 Enteropathogenic Escherichia coli infection: Secondary | ICD-10-CM | POA: Diagnosis present

## 2019-09-07 LAB — GASTROINTESTINAL PANEL BY PCR, STOOL (REPLACES STOOL CULTURE)
Adenovirus F40/41: NOT DETECTED
Astrovirus: NOT DETECTED
Campylobacter species: NOT DETECTED
Cryptosporidium: NOT DETECTED
Cyclospora cayetanensis: NOT DETECTED
Entamoeba histolytica: NOT DETECTED
Enteroaggregative E coli (EAEC): NOT DETECTED
Enteropathogenic E coli (EPEC): DETECTED — AB
Enterotoxigenic E coli (ETEC): NOT DETECTED
Giardia lamblia: NOT DETECTED
Norovirus GI/GII: NOT DETECTED
Plesimonas shigelloides: NOT DETECTED
Rotavirus A: NOT DETECTED
Salmonella species: DETECTED — AB
Sapovirus (I, II, IV, and V): NOT DETECTED
Shiga like toxin producing E coli (STEC): NOT DETECTED
Shigella/Enteroinvasive E coli (EIEC): NOT DETECTED
Vibrio cholerae: NOT DETECTED
Vibrio species: NOT DETECTED
Yersinia enterocolitica: NOT DETECTED

## 2019-09-07 LAB — C3 COMPLEMENT: C3 Complement: 196 mg/dL — ABNORMAL HIGH (ref 82–167)

## 2019-09-07 LAB — BASIC METABOLIC PANEL
Anion gap: 13 (ref 5–15)
BUN: 42 mg/dL — ABNORMAL HIGH (ref 6–20)
CO2: 24 mmol/L (ref 22–32)
Calcium: 7.7 mg/dL — ABNORMAL LOW (ref 8.9–10.3)
Chloride: 100 mmol/L (ref 98–111)
Creatinine, Ser: 1.94 mg/dL — ABNORMAL HIGH (ref 0.44–1.00)
GFR calc Af Amer: 32 mL/min — ABNORMAL LOW (ref >60)
GFR calc non Af Amer: 27 mL/min — ABNORMAL LOW (ref >60)
Glucose, Bld: 139 mg/dL — ABNORMAL HIGH (ref 70–99)
Potassium: 2.3 mmol/L — CL (ref 3.5–5.1)
Sodium: 137 mmol/L (ref 135–145)

## 2019-09-07 LAB — PTH, INTACT AND CALCIUM
Calcium, Total (PTH): 8.1 mg/dL — ABNORMAL LOW (ref 8.7–10.3)
PTH: 102 pg/mL — ABNORMAL HIGH (ref 15–65)

## 2019-09-07 LAB — C DIFFICILE QUICK SCREEN W PCR REFLEX
C Diff antigen: NEGATIVE
C Diff interpretation: NOT DETECTED
C Diff toxin: NEGATIVE

## 2019-09-07 LAB — LACTOFERRIN, FECAL, QUALITATIVE: Lactoferrin, Fecal, Qual: POSITIVE — AB

## 2019-09-07 LAB — C4 COMPLEMENT: Complement C4, Body Fluid: 31 mg/dL (ref 12–38)

## 2019-09-07 MED ORDER — LOPERAMIDE HCL 2 MG PO CAPS
2.0000 mg | ORAL_CAPSULE | Freq: Once | ORAL | Status: AC
  Administered 2019-09-07: 2 mg via ORAL
  Filled 2019-09-07: qty 1

## 2019-09-07 MED ORDER — LEVOFLOXACIN 500 MG PO TABS
500.0000 mg | ORAL_TABLET | Freq: Every day | ORAL | Status: DC
  Administered 2019-09-07 – 2019-09-08 (×2): 500 mg via ORAL
  Filled 2019-09-07 (×2): qty 1

## 2019-09-07 MED ORDER — ADULT MULTIVITAMIN W/MINERALS CH
1.0000 | ORAL_TABLET | Freq: Every day | ORAL | Status: DC
  Administered 2019-09-08 – 2019-09-09 (×2): 1 via ORAL
  Filled 2019-09-07 (×2): qty 1

## 2019-09-07 MED ORDER — POTASSIUM CHLORIDE IN NACL 20-0.9 MEQ/L-% IV SOLN
INTRAVENOUS | Status: DC
  Administered 2019-09-07 – 2019-09-09 (×6): via INTRAVENOUS
  Filled 2019-09-07 (×7): qty 1000

## 2019-09-07 NOTE — Progress Notes (Signed)
Per Erin Hearing request, informed patient of the two bacteria found and that MD has placed her on an antibiotic for same.  Donne [place], RN

## 2019-09-07 NOTE — Progress Notes (Signed)
Stool sample results received; Dr Tonye Royalty paged to make him aware. Results: Salmonella and Enteropathogenic E. Coli (EPEC).  Donne [place], RN

## 2019-09-07 NOTE — Progress Notes (Signed)
CRITICAL VALUE ALERT  Critical Value:  Potassium 2.3  Date & Time Notied:  09/07/2019 @ 1586  Provider Notified: Dr Tonye Royalty  Orders Received/Actions taken:

## 2019-09-07 NOTE — Progress Notes (Signed)
PHARMACY NOTE:  ANTIMICROBIAL RENAL DOSAGE ADJUSTMENT  Current antimicrobial regimen includes a mismatch between antimicrobial dosage and estimated renal function.  As per policy approved by the Pharmacy & Therapeutics and Medical Executive Committees, the antimicrobial dosage will be adjusted accordingly.  Current antimicrobial dosage:  Levaquin 750mg  q24  Indication: Gastroenteritis with E coli/Salmonella  Renal Function:   Estimated Creatinine Clearance: 34.4 mL/min (A) (by C-G formula based on SCr of 1.94 mg/dL (H)). []      On intermittent HD, scheduled: []      On CRRT    Antimicrobial dosage has been changed to:  Levaquin 500mg  q24   Additional Comments: will monitor renal function and increase Levaquin if appropriate   Thank you for allowing pharmacy to be a part of this patient's care.  Minda Ditto PharmD 09/07/2019 2:32 PM

## 2019-09-07 NOTE — Progress Notes (Signed)
Initial Nutrition Assessment  DOCUMENTATION CODES:   Not applicable  INTERVENTION:  -Continue Boost Breeze po TID, each supplement provides 250 kcal and 9 grams of protein  -MVI with minerals daily  -Advance diet as medically feasible   NUTRITION DIAGNOSIS:   Increased nutrient needs related to altered GI function, acute illness(acute gastroenteritis secondary to salmonella;enteropathogenic E Coli) as evidenced by per patient/family report, other (comment)(3 day history of severe diarrhea;hypokalemia).    GOAL:   Patient will meet greater than or equal to 90% of their needs    MONITOR:   PO intake, Supplement acceptance, Labs, I & O's, Diet advancement, Weight trends  REASON FOR ASSESSMENT:   Malnutrition Screening Tool    ASSESSMENT:  RD working remotely.  58 year old female with no documented past medical history who reports that has not seen a physician in 20 years presented to ED with complaints of 3 day history of severe diarrhea. Patient reports stools are watery and nonbloody. Pertinent lab work revealed K of 2.9, BUN of 66 and Cr of 5.  Patient admitted with AKI and acute gastroenteritis secondary to salmonella and enteropathogenic E Coli.  Per chart review, NP noted that patient continues to have diarrhea and may have atypical presentation of Covid. Stool studies currently pending, renal function slowly improving.   Patient eating 100% of meals s/p soft diet advancement. Will continue to monitor for po intake of meals and diet advancement. Patient is provided Boost Breeze to aid with calorie and protein needs.   No weight history available for review Medications reviewed and include: Klor-con NaCl Labs: Mean Plasma Glucose 125.5, K 2.3 (L), Cr 1.94 (H), BUN 42 (H), Ca 7.7 (L)   NUTRITION - FOCUSED PHYSICAL EXAM: Unable to complete at this time, RD working remotely due to noted possible atypical presentation of Covid.   Diet Order:   Diet Order             DIET SOFT Room service appropriate? Yes; Fluid consistency: Thin  Diet effective now              EDUCATION NEEDS:   No education needs have been identified at this time  Skin:  Skin Assessment: Reviewed RN Assessment  Last BM:  11/27 (type 7;large)  Height:   Ht Readings from Last 1 Encounters:  09/06/19 5\' 7"  (1.702 m)    Weight:   Wt Readings from Last 1 Encounters:  09/06/19 84.1 kg    Ideal Body Weight:  61.4 kg  BMI:  Body mass index is 29.04 kg/m.  Estimated Nutritional Needs:   Kcal:  1800-2000  Protein:  90-100  Fluid:  >/= 1.8 L/day   Lajuan Lines, RD, LDN Clinical Nutrition [facility] Telephone [phone] After Hours/Weekend Pager: [phone]

## 2019-09-07 NOTE — Progress Notes (Addendum)
[facility] APP - Progress Note  Rebecca Huber LGX:[id number] DOB: [birth year] DOA: 09/05/2019 PCP: Patient, No Pcp Per   Brief narrative: Patient is a 58 year old African American female with no documented past medical history. Apparently, patient has not seen a physician for over 20 years. Patient presents with 3-day history of severe diarrhea. According to the patient, she had diarrhea stool every time she had an oral intake. The stool is said to be watery and nonbloody. Stool is possibly mucoid. No associated fever or chills, no associated crampy abdominal pain. No sick contacts. Patientate at discharge about 5 days prior to onset of symptoms, otherwise, no history of eating outside of home. Patient lives alone. The diarrhea has improved to some extent. Patient reported 5 episodes of diarrhea stool today. Patient had nausea and vomiting initially (the day of onset of symptoms) but none for now. No headache, no neck pain, no chest pain, no shortness of breath, no URI symptoms and no urinary symptoms. Blood pressure of 175/97 mmHg was documented in presentation. Patient denies prior history of hypertension. Pertinent lab work revealed potassium of 2.9, BUN of 66, serum creatinine of 5, CO2 of 12, hemoglobin of 14.8 g/dL but patient is likely hemoconcentrated, blood sugar is 127 and lactic acid is 1. Specific gravity of 1.013 with urine protein of 30. Hyaline casts was present. Hospitalist team has been asked to admit patient for further assessment and management.  ED Course:On presentation to the hospital, temperature was 98.7, blood pressure 175/97, heart rate of 102, respiratory rate of 18 and O2 sat of 98%. Pertinent labs as documented above. Patient has been volume resuscitated with 1 L of normal saline. Potassium is being repleted. Basic lab work has been sent. Hospitalist team has been asked to admit patient for further assessment and management.  Subjective:  Continues to have watery brown diarrhea without abdominal pain or nausea.  No myalgias reported.  Assessment/Plan: Active Problems:   AKI (acute kidney injury) (HCC) -Baseline creatinine unknown-patient with a creatinine of 5.00 with a BUN of 66 -As of 11/27 creatinine has decreased to 9 4 the BUN of 42 -Metabolic acidosis has resolved with correction of serum CO2 therefore will DC bicarbonate infusion -Transition to normal saline with potassium 125 cc/h -UA reveals some protein -renal ultrasound unremarkable doubt evidence of medical renal disease -Urine sodium 12 which is not unexpected given suspected severe hypovolemia (serum sodium normal), urine protein 85 with elevated protein/creatinine ratio of 0.45. -ANA and collection, C3 slightly elevated at 196 and C4  81 although renal ultrasound is unremarkable; TSH normal at 2.589; HIV negative - strict intake and output ordered-and voided x3 in the past 24 hours -Low threshold to consult nephrology team    Acute gastroenteritis 2/2 salmonella and enteropathogenic E Coli -C. difficile PCR NEG -fecal lactoferrin + and GI pathogen panel POSITIVE Salmonella enteropathogenic E. coli therefore Levaquin 750 mg po initiated on 11/27.  Stool for O&P still pending -Continue supportive care and advance diet slowly as tolerated    Acute dehydration -Hydrate patient as above -Strict I's and O's    Acute hypokalemia -secondary to GI loss -IV and oral repletion -Magnesium and phosphorus as inpatient and likely related to acute kidney injury and decreased GFR    Elevated blood pressure without diagnosis of hypertension -Suspect secondary to appropriate response to dehydration and low renal perfusion -Continue to monitor blood pressure and if necessary institute antihypertensive medication prior to discharge    Acute hyperglycemia -HbA1c 6.0  consistent with prediabetes -UA without glycosuria ketones   Low vitamin D - vitamin D, 25-hydroxy  low at 4.93 -Can initiate vitamin D supplementation at discharge -Patient postmenopausal and high risk for osteoporosis therefore once establishes with PCP would benefit from DEXA scan to detect any mineralized bone loss   DVT prophylaxis: SQ heparin  Code Status:  Full  Family Communication:  Patient only  Disposition Plan/Expected LOS: Inpatient, patient presents with diarrhea with significant elevation in serum creatinine with no apparent past medical history of renal disease.  Etiology seems to be related to acute viral illness.  Plan is to continue IV hydration and follow labs as well as intake and output closely.  Acidemia has resolved and patient will be transitioned off of bicarbonate drip in favor of maintenance fluids.  Continues to have diarrhea and may have atypical presentation of Covid.  Stool studies either have not been collected or are pending.  Renal function is slowly improving and she continues to require IV fluids at this time.  As of 11/27 diet has been advanced from liquids to bland soft.  Consultants: None  Procedures: Renal ultrasound: Unremarkable renal ultrasound  Cultures: Fecal lactoferrin positive GI pathogen panel positive for Salmonella and enteropathogenic E. coli Stool for O&P pending Stool for C. difficile PCR negative  Antibiotics: None  Objective: Blood pressure 136/69, pulse 79, temperature 99.3 F (37.4 C), temperature source Oral, resp. rate 16, height 5\' 7"  (1.702 m), weight 84.1 kg, SpO2 98 %.  Intake/Output Summary (Last 24 hours) at 09/07/2019 0744 Last data filed at 09/06/2019 1500 Gross per 24 hour  Intake 836.64 ml  Output -  Net 836.64 ml     Exam: Gen: No acute respiratory distress Chest: Clear to auscultation bilaterally without wheezes, rhonchi or crackles, room air Cardiac: Regular rate and rhythm, S1-S2, no rubs murmurs or gallops, no peripheral edema, no JVD Abdomen: Soft nontender nondistended without obvious  hepatosplenomegaly, no ascites-continues with watery brown diarrhea Extremities: Symmetrical in appearance without cyanosis, clubbing or effusion  Scheduled Meds:  Scheduled Meds: . feeding supplement  1 Container Oral TID BM  . heparin  5,000 Units Subcutaneous Q8H  . potassium chloride  40 mEq Oral BID   Continuous Infusions: . sodium bicarbonate 150 mEq in dextrose 5% 1000 mL 150 mEq (09/07/19 0523)    Data Reviewed: Basic Metabolic Panel: Recent Labs  Lab 09/05/19 2142 09/05/19 2332 09/06/19 0530  NA 135 135 136  K 2.9* 2.7* 3.0*  CL 101 104 105  CO2 12* 13* 14*  GLUCOSE 127* 107* 105*  BUN 66* 65* 64*  CREATININE 5.00* 4.59* 4.23*  CALCIUM 8.9 8.0* 8.0*  MG 2.3  --   --   PHOS 9.0*  --   --    Liver Function Tests: Recent Labs  Lab 09/05/19 2142 09/06/19 0530  AST 26 24  ALT 16 13  ALKPHOS 55 46  BILITOT 0.7 0.4  PROT 9.2* 7.5  ALBUMIN 4.2 3.5   Recent Labs  Lab 09/05/19 2142 09/06/19 0530  LIPASE 215* 332*   No results for input(s): AMMONIA in the last 168 hours. CBC: Recent Labs  Lab 09/05/19 2142 09/06/19 0827  WBC 8.2 6.6  HGB 14.8 12.9  HCT 44.8 38.8  MCV 77.5* 77.3*  PLT 253 204   Cardiac Enzymes: No results for input(s): CKTOTAL, CKMB, CKMBINDEX, TROPONINI in the last 168 hours. BNP (last 3 results) No results for input(s): BNP in the last 8760 hours.  ProBNP (last 3 results)  No results for input(s): PROBNP in the last 8760 hours.  CBG: No results for input(s): GLUCAP in the last 168 hours.  Recent Results (from the past 240 hour(s))  SARS CORONAVIRUS 2 (TAT 6-24 HRS) Nasopharyngeal Nasopharyngeal Swab     Status: None   Collection Time: 09/06/19  1:14 AM   Specimen: Nasopharyngeal Swab  Result Value Ref Range Status   SARS Coronavirus 2 NEGATIVE NEGATIVE Final    Comment: (NOTE) SARS-CoV-2 target nucleic acids are NOT DETECTED. The SARS-CoV-2 RNA is generally detectable in upper and lower respiratory specimens during the  acute phase of infection. Negative results do not preclude SARS-CoV-2 infection, do not rule out co-infections with other pathogens, and should not be used as the sole basis for treatment or other patient management decisions. Negative results must be combined with clinical observations, patient history, and epidemiological information. The expected result is Negative. Fact Sheet for Patients: SugarRoll.be Fact Sheet for Healthcare Providers: https://www.woods-mathews.com/ This test is not yet approved or cleared by the [place] FDA and  has been authorized for detection and/or diagnosis of SARS-CoV-2 by FDA under an Emergency Use Authorization (EUA). This EUA will remain  in effect (meaning this test can be used) for the duration of the COVID-19 declaration under Section 56 4(b)(1) of the Act, 21 U.S.C. section 360bbb-3(b)(1), unless the authorization is terminated or revoked sooner. Performed at [facility], [place] [address] [place], [place] [address]      BMI  PRESSUREULCER  MALNUTRITIONNOTE  Studies:  Recent x-ray studies have been reviewed in detail by the Attending Physician  Time spent :      Erin Hearing, [facility] Office  [phone] Pager [phone]   **If unable to reach the above provider after paging please contact the [place] @ [phone]  On-Call/Text Page:      [email or web address]      password [email or web address]  If 7PM-7AM, please contact night-coverage [email or web address] Password [facility] 09/07/2019, 7:44 AM   LOS: 2 days

## 2019-09-08 ENCOUNTER — Encounter (HOSPITAL_COMMUNITY): Payer: Self-pay

## 2019-09-08 LAB — BASIC METABOLIC PANEL
Anion gap: 8 (ref 5–15)
BUN: 21 mg/dL — ABNORMAL HIGH (ref 6–20)
CO2: 23 mmol/L (ref 22–32)
Calcium: 7.7 mg/dL — ABNORMAL LOW (ref 8.9–10.3)
Chloride: 110 mmol/L (ref 98–111)
Creatinine, Ser: 1.29 mg/dL — ABNORMAL HIGH (ref 0.44–1.00)
GFR calc Af Amer: 52 mL/min — ABNORMAL LOW (ref >60)
GFR calc non Af Amer: 45 mL/min — ABNORMAL LOW (ref >60)
Glucose, Bld: 92 mg/dL (ref 70–99)
Potassium: 3.3 mmol/L — ABNORMAL LOW (ref 3.5–5.1)
Sodium: 141 mmol/L (ref 135–145)

## 2019-09-08 MED ORDER — LEVOFLOXACIN 750 MG PO TABS
750.0000 mg | ORAL_TABLET | Freq: Every day | ORAL | Status: DC
  Administered 2019-09-09: 750 mg via ORAL
  Filled 2019-09-08: qty 1

## 2019-09-08 NOTE — Progress Notes (Signed)
PHARMACY NOTE:  ANTIMICROBIAL RENAL DOSAGE ADJUSTMENT  Current antimicrobial regimen includes a mismatch between antimicrobial dosage and estimated renal function.  As per policy approved by the Pharmacy & Therapeutics and Medical Executive Committees, the antimicrobial dosage will be adjusted accordingly.  Current antimicrobial dosage:  Levaquin 500mg  q24  Indication: Gastroenteritis with E coli/Salmonella  Renal Function:   Estimated Creatinine Clearance: 51.7 mL/min (A) (by C-G formula based on SCr of 1.29 mg/dL (H)). []      On intermittent HD, scheduled: []      On CRRT    Antimicrobial dosage has been changed to:  Levaquin 750mg  q24   Additional Comments: will monitor renal function and increase Levaquin if appropriate   Thank you for allowing pharmacy to be a part of this patient's care.  Cammie Faulstich A PharmD 09/08/2019 12:17 PM

## 2019-09-08 NOTE — Progress Notes (Signed)
PROGRESS NOTE    CASSIA FEIN  WER:[id number] DOB: 1961/07/06 DOA: 09/05/2019 PCP: Patient, No Pcp Per  Brief Narrative: Patient is feeling fair.  Gallbladder discomfort and diarrhea have subsided some.  She had shown Salmonella and enteropathogenic E. coli in the stool.  She has been started on Levaquin and the doses have been adjusted.  Assessment & Plan:   Active Problems:   AKI (acute kidney injury) (HCC)   Salmonella enteritis   Enteropathogenic Escherichia coli infection Continue the antibiotics the day update labs make sure that she is hemodynamically stable before discharge he still has low-grade elevation of temperature.  Her potassium remains low DVT prophylaxis: AKI Code Status: Full Family Communication: Full code with her son briefly on rounds Disposition Plan: Watch her 1 more day of antibiotics follow labs and make and ensure hemodynamic stability, adjustments before discharge.   Consultants: None  Procedures: None Antimicrobials: Levaquin  Subjective: Noted above  Objective: Vitals:   09/07/19 0457 09/07/19 1333 09/07/19 2052 09/08/19 0508  BP: 136/69 (!) 159/76 (!) 162/81 (!) 154/90  Pulse: 79 79 86 82  Resp: 16 16 16 14   Temp: 99.3 F (37.4 C) 99 F (37.2 C) 98.3 F (36.8 C) 98.6 F (37 C)  TempSrc: Oral Oral Oral Oral  SpO2: 98% 99% 100% 100%  Weight:      Height:        Intake/Output Summary (Last 24 hours) at 09/08/2019 1244 Last data filed at 09/08/2019 0500 Gross per 24 hour  Intake 2810.52 ml  Output -  Net 2810.52 ml   Filed Weights   09/06/19 0330  Weight: 84.1 kg    Examination: She is very comfortable quite communicative.  Low-grade temperature elevation is noted. Chest is clear without any significant adventitious sounds. Abdomen is somewhat tense but nontender.  Active bowel sounds. Neurologically she is grossly intact alert oriented insightful..  Her affect is normal    Data Reviewed: I GFR is slowly improving potassium  stays somewhat low. Her lipase was somewhat elevated will repeat assess.  CBC: Recent Labs  Lab 09/05/19 2142 09/06/19 0827  WBC 8.2 6.6  HGB 14.8 12.9  HCT 44.8 38.8  MCV 77.5* 77.3*  PLT 253 204   Basic Metabolic Panel: Recent Labs  Lab 09/05/19 2142 09/05/19 2332 09/06/19 0041 09/06/19 0530 09/07/19 0735 09/08/19 0513  NA 135 135  --  136 137 141  K 2.9* 2.7*  --  3.0* 2.3* 3.3*  CL 101 104  --  105 100 110  CO2 12* 13*  --  14* 24 23  GLUCOSE 127* 107*  --  105* 139* 92  BUN 66* 65*  --  64* 42* 21*  CREATININE 5.00* 4.59*  --  4.23* 1.94* 1.29*  CALCIUM 8.9 8.0* 8.1* 8.0* 7.7* 7.7*  MG 2.3  --   --   --   --   --   PHOS 9.0*  --   --   --   --   --    GFR: Estimated Creatinine Clearance: 51.7 mL/min (A) (by C-G formula based on SCr of 1.29 mg/dL (H)). Liver Function Tests: Recent Labs  Lab 09/05/19 2142 09/06/19 0530  AST 26 24  ALT 16 13  ALKPHOS 55 46  BILITOT 0.7 0.4  PROT 9.2* 7.5  ALBUMIN 4.2 3.5   Recent Labs  Lab 09/05/19 2142 09/06/19 0530  LIPASE 215* 332*   No results for input(s): AMMONIA in the last 168 hours. Coagulation Profile: No results  for input(s): INR, PROTIME in the last 168 hours. Cardiac Enzymes: No results for input(s): CKTOTAL, CKMB, CKMBINDEX, TROPONINI in the last 168 hours. BNP (last 3 results) No results for input(s): PROBNP in the last 8760 hours. HbA1C: Recent Labs    09/06/19 0827  HGBA1C 6.0*   CBG: No results for input(s): GLUCAP in the last 168 hours. Lipid Profile: No results for input(s): CHOL, HDL, LDLCALC, TRIG, CHOLHDL, LDLDIRECT in the last 72 hours. Thyroid Function Tests: Recent Labs    09/06/19 0827  TSH 2.589   Anemia Panel: No results for input(s): VITAMINB12, FOLATE, FERRITIN, TIBC, IRON, RETICCTPCT in the last 72 hours. Sepsis Labs: Recent Labs  Lab 09/05/19 2304  LATICACIDVEN 1.0    Recent Results (from the past 240 hour(s))  SARS CORONAVIRUS 2 (TAT 6-24 HRS) Nasopharyngeal  Nasopharyngeal Swab     Status: None   Collection Time: 09/06/19  1:14 AM   Specimen: Nasopharyngeal Swab  Result Value Ref Range Status   SARS Coronavirus 2 NEGATIVE NEGATIVE Final    Comment: (NOTE) SARS-CoV-2 target nucleic acids are NOT DETECTED. The SARS-CoV-2 RNA is generally detectable in upper and lower respiratory specimens during the acute phase of infection. Negative results do not preclude SARS-CoV-2 infection, do not rule out co-infections with other pathogens, and should not be used as the sole basis for treatment or other patient management decisions. Negative results must be combined with clinical observations, patient history, and epidemiological information. The expected result is Negative. Fact Sheet for Patients: HairSlick.nohttps://www.fda.gov/media/138098/download Fact Sheet for Healthcare Providers: quierodirigir.comhttps://www.fda.gov/media/138095/download This test is not yet approved or cleared by the [place] FDA and  has been authorized for detection and/or diagnosis of SARS-CoV-2 by FDA under an Emergency Use Authorization (EUA). This EUA will remain  in effect (meaning this test can be used) for the duration of the COVID-19 declaration under Section 56 4(b)(1) of the Act, 21 U.S.C. section 360bbb-3(b)(1), unless the authorization is terminated or revoked sooner. Performed at [facility], [address] [place], [place] [address]   Gastrointestinal Panel by PCR , Stool     Status: Abnormal   Collection Time: 09/07/19  9:05 AM   Specimen: STOOL  Result Value Ref Range Status   Campylobacter species NOT DETECTED NOT DETECTED Final   Plesimonas shigelloides NOT DETECTED NOT DETECTED Final   Salmonella species DETECTED (A) NOT DETECTED Final    Comment: RESULT CALLED TO, READ BACK BY AND VERIFIED WITH: BETH PASSMORE AT 1335 09/07/2019.PMF    Yersinia enterocolitica NOT DETECTED NOT DETECTED Final   Vibrio species NOT DETECTED NOT DETECTED Final   Vibrio cholerae NOT  DETECTED NOT DETECTED Final   Enteroaggregative E coli (EAEC) NOT DETECTED NOT DETECTED Final   Enteropathogenic E coli (EPEC) DETECTED (A) NOT DETECTED Final    Comment: RESULT CALLED TO, READ BACK BY AND VERIFIED WITH: BETH PASSMORE AT 1335 09/07/2019.PMF    Enterotoxigenic E coli (ETEC) NOT DETECTED NOT DETECTED Final   Shiga like toxin producing E coli (STEC) NOT DETECTED NOT DETECTED Final   Shigella/Enteroinvasive E coli (EIEC) NOT DETECTED NOT DETECTED Final   Cryptosporidium NOT DETECTED NOT DETECTED Final   Cyclospora cayetanensis NOT DETECTED NOT DETECTED Final   Entamoeba histolytica NOT DETECTED NOT DETECTED Final   Giardia lamblia NOT DETECTED NOT DETECTED Final   Adenovirus F40/41 NOT DETECTED NOT DETECTED Final   Astrovirus NOT DETECTED NOT DETECTED Final   Norovirus GI/GII NOT DETECTED NOT DETECTED Final   Rotavirus A NOT DETECTED  NOT DETECTED Final   Sapovirus (I, II, IV, and V) NOT DETECTED NOT DETECTED Final    Comment: Performed at [facility], [address] [place], [place] [address]  C difficile quick scan w PCR reflex     Status: None   Collection Time: 09/07/19  9:05 AM   Specimen: STOOL  Result Value Ref Range Status   C Diff antigen NEGATIVE NEGATIVE Final   C Diff toxin NEGATIVE NEGATIVE Final   C Diff interpretation No C. difficile detected.  Final    Comment: Performed at [facility], [place] [address] [place], [place] [address]         Radiology Studies: No results found.      Scheduled Meds: . feeding supplement  1 Container Oral TID BM  . heparin  5,000 Units Subcutaneous Q8H  . [START ON 09/09/2019] levofloxacin  750 mg Oral Daily  . multivitamin with minerals  1 tablet Oral Daily   Continuous Infusions: . 0.9 % NaCl with KCl 20 mEq / L 125 mL/hr at 09/08/19 0314     LOS: 3 days   Will update all labs. Replace potassium.  Ambulate and plan discharge   Time spent: 30 minutes.    Pearlean Brownie, MD  [facility] Pager [phone]  If 7PM-7AM, please contact night-coverage [email or web address] Password [facility] 09/08/2019, 12:44 PM

## 2019-09-09 LAB — CBC
HCT: 33.7 % — ABNORMAL LOW (ref 36.0–46.0)
Hemoglobin: 10.9 g/dL — ABNORMAL LOW (ref 12.0–15.0)
MCH: 25.6 pg — ABNORMAL LOW (ref 26.0–34.0)
MCHC: 32.3 g/dL (ref 30.0–36.0)
MCV: 79.1 fL — ABNORMAL LOW (ref 80.0–100.0)
Platelets: 214 10*3/uL (ref 150–400)
RBC: 4.26 MIL/uL (ref 3.87–5.11)
RDW: 13.7 % (ref 11.5–15.5)
WBC: 6.6 10*3/uL (ref 4.0–10.5)
nRBC: 0 % (ref 0.0–0.2)

## 2019-09-09 LAB — COMPREHENSIVE METABOLIC PANEL
ALT: 11 U/L (ref 0–44)
AST: 16 U/L (ref 15–41)
Albumin: 2.8 g/dL — ABNORMAL LOW (ref 3.5–5.0)
Alkaline Phosphatase: 38 U/L (ref 38–126)
Anion gap: 6 (ref 5–15)
BUN: 9 mg/dL (ref 6–20)
CO2: 21 mmol/L — ABNORMAL LOW (ref 22–32)
Calcium: 7.9 mg/dL — ABNORMAL LOW (ref 8.9–10.3)
Chloride: 115 mmol/L — ABNORMAL HIGH (ref 98–111)
Creatinine, Ser: 0.93 mg/dL (ref 0.44–1.00)
GFR calc Af Amer: >60 mL/min (ref >60)
GFR calc non Af Amer: >60 mL/min (ref >60)
Glucose, Bld: 96 mg/dL (ref 70–99)
Potassium: 3.7 mmol/L (ref 3.5–5.1)
Sodium: 142 mmol/L (ref 135–145)
Total Bilirubin: 0.6 mg/dL (ref 0.3–1.2)
Total Protein: 5.9 g/dL — ABNORMAL LOW (ref 6.5–8.1)

## 2019-09-09 LAB — LIPASE, BLOOD: Lipase: 273 U/L — ABNORMAL HIGH (ref 11–51)

## 2019-09-09 MED ORDER — LEVOFLOXACIN 750 MG PO TABS: 750.0000 mg | ORAL_TABLET | Freq: Every day | ORAL | 0 refills | Status: AC

## 2019-09-09 MED ORDER — ADULT MULTIVITAMIN W/MINERALS CH: 1.0000 | ORAL_TABLET | Freq: Every day | ORAL | 3 refills | Status: AC

## 2019-09-09 MED ORDER — HYDRALAZINE HCL 25 MG PO TABS: 25.0000 mg | ORAL_TABLET | Freq: Two times a day (BID) | ORAL | 1 refills | Status: DC

## 2019-09-09 NOTE — Progress Notes (Signed)
Patient given discharge, follow up, and medication instructions, verbalized understanding, IVs x 2 removed, prescriptions and personal belongings with patient, family to transport home

## 2019-09-09 NOTE — Discharge Summary (Addendum)
PROGRESS NOTE    Rebecca Huber  ZOX:[id number] DOB: 1961-07-29 DOA: 09/05/2019 PCP: Patient, No Pcp Per Brief Narrative: Please see the previous note   Assessment & Plan:   Active Problems:   AKI (acute kidney injury) (HCC)   Salmonella enteritis   Enteropathogenic Escherichia coli infection   Essential hypertension     Code Status: Full  family Communication: None Disposition Plan: Charge home.   Consultants: None  Procedures:  Antimicrobials: Levaquin   Subjective: Current complaints  Objective: Vitals:   09/09/19 0542 09/09/19 0827 09/09/19 1342 09/09/19 1343  BP: (!) 185/91 (!) 178/91  (!) 176/92  Pulse: 79   87  Resp:    16  Temp:   98.6 F (37 C)   TempSrc:   Oral   SpO2:    100%  Weight:      Height:        Intake/Output Summary (Last 24 hours) at 09/09/2019 1356 Last data filed at 09/09/2019 0404 Gross per 24 hour  Intake 2600 ml  Output -  Net 2600 ml   Filed Weights   09/06/19 0330  Weight: 84.1 kg    Examination:  General exam: Appears calm and comfortable  Respiratory system: Clear to auscultation. Respiratory effort normal. Cardiovascular system: S1 & S2 heard, RRR. No JVD, murmurs, rubs, gallops or clicks. No pedal edema. Gastrointestinal system: Abdomen is nondistended, soft and nontender. No organomegaly or masses felt. Normal bowel sounds heard. Central nervous system: Alert and oriented. No focal neurological deficits. Extremities: Symmetric 5 x 5 power. Skin: No rashes, lesions or ulcers Psychiatry: Judgement and insight appear normal. Mood & affect appropriate.     Data Reviewed: I have personally reviewed following labs and imaging studies hemoglobin dropped to 10.9.  Potassium is now normal at 3.7 BUN is normal and her kidney function has been restored to normal  CBC: Recent Labs  Lab 09/05/19 2142 09/06/19 0827 09/09/19 0520  WBC 8.2 6.6 6.6  HGB 14.8 12.9 10.9*  HCT 44.8 38.8 33.7*  MCV 77.5* 77.3* 79.1*  PLT  253 204 214   Basic Metabolic Panel: Recent Labs  Lab 09/05/19 2142 09/05/19 2332 09/06/19 0041 09/06/19 0530 09/07/19 0735 09/08/19 0513 09/09/19 0520  NA 135 135  --  136 137 141 142  K 2.9* 2.7*  --  3.0* 2.3* 3.3* 3.7  CL 101 104  --  105 100 110 115*  CO2 12* 13*  --  14* 24 23 21*  GLUCOSE 127* 107*  --  105* 139* 92 96  BUN 66* 65*  --  64* 42* 21* 9  CREATININE 5.00* 4.59*  --  4.23* 1.94* 1.29* 0.93  CALCIUM 8.9 8.0* 8.1* 8.0* 7.7* 7.7* 7.9*  MG 2.3  --   --   --   --   --   --   PHOS 9.0*  --   --   --   --   --   --    GFR: Estimated Creatinine Clearance: 71.7 mL/min (by C-G formula based on SCr of 0.93 mg/dL). Liver Function Tests: Recent Labs  Lab 09/05/19 2142 09/06/19 0530 09/09/19 0520  AST ALT ALKPHOS 55 46 38  BILITOT 0.7 0.4 0.6  PROT 9.2* 7.5 5.9*  ALBUMIN 4.2 3.5 2.8*   Recent Labs  Lab 09/05/19 2142 09/06/19 0530 09/09/19 0520  LIPASE 215* 332* 273*   No results for input(s): AMMONIA in the last 168 hours. Coagulation Profile:  No results for input(s): INR, PROTIME in the last 168 hours. Cardiac Enzymes: No results for input(s): CKTOTAL, CKMB, CKMBINDEX, TROPONINI in the last 168 hours. BNP (last 3 results) No results for input(s): PROBNP in the last 8760 hours. HbA1C: No results for input(s): HGBA1C in the last 72 hours. CBG: No results for input(s): GLUCAP in the last 168 hours. Lipid Profile: No results for input(s): CHOL, HDL, LDLCALC, TRIG, CHOLHDL, LDLDIRECT in the last 72 hours. Thyroid Function Tests: No results for input(s): TSH, T4TOTAL, FREET4, T3FREE, THYROIDAB in the last 72 hours. Anemia Panel: No results for input(s): VITAMINB12, FOLATE, FERRITIN, TIBC, IRON, RETICCTPCT in the last 72 hours. Sepsis Labs: Recent Labs  Lab 09/05/19 2304  LATICACIDVEN 1.0    Recent Results (from the past 240 hour(s))  SARS CORONAVIRUS 2 (TAT 6-24 HRS) Nasopharyngeal Nasopharyngeal Swab     Status: None    Collection Time: 09/06/19  1:14 AM   Specimen: Nasopharyngeal Swab  Result Value Ref Range Status   SARS Coronavirus 2 NEGATIVE NEGATIVE Final    Comment: (NOTE) SARS-CoV-2 target nucleic acids are NOT DETECTED. The SARS-CoV-2 RNA is generally detectable in upper and lower respiratory specimens during the acute phase of infection. Negative results do not preclude SARS-CoV-2 infection, do not rule out co-infections with other pathogens, and should not be used as the sole basis for treatment or other patient management decisions. Negative results must be combined with clinical observations, patient history, and epidemiological information. The expected result is Negative. Fact Sheet for Patients: HairSlick.nohttps://www.fda.gov/media/138098/download Fact Sheet for Healthcare Providers: quierodirigir.comhttps://www.fda.gov/media/138095/download This test is not yet approved or cleared by the [place] FDA and  has been authorized for detection and/or diagnosis of SARS-CoV-2 by FDA under an Emergency Use Authorization (EUA). This EUA will remain  in effect (meaning this test can be used) for the duration of the COVID-19 declaration under Section 56 4(b)(1) of the Act, 21 U.S.C. section 360bbb-3(b)(1), unless the authorization is terminated or revoked sooner. Performed at [facility], [address] [place], [place] [address]   Gastrointestinal Panel by PCR , Stool     Status: Abnormal   Collection Time: 09/07/19  9:05 AM   Specimen: STOOL  Result Value Ref Range Status   Campylobacter species NOT DETECTED NOT DETECTED Final   Plesimonas shigelloides NOT DETECTED NOT DETECTED Final   Salmonella species DETECTED (A) NOT DETECTED Final    Comment: RESULT CALLED TO, READ BACK BY AND VERIFIED WITH: BETH PASSMORE AT 1335 09/07/2019.PMF    Yersinia enterocolitica NOT DETECTED NOT DETECTED Final   Vibrio species NOT DETECTED NOT DETECTED Final   Vibrio cholerae NOT DETECTED NOT DETECTED Final    Enteroaggregative E coli (EAEC) NOT DETECTED NOT DETECTED Final   Enteropathogenic E coli (EPEC) DETECTED (A) NOT DETECTED Final    Comment: RESULT CALLED TO, READ BACK BY AND VERIFIED WITH: BETH PASSMORE AT 1335 09/07/2019.PMF    Enterotoxigenic E coli (ETEC) NOT DETECTED NOT DETECTED Final   Shiga like toxin producing E coli (STEC) NOT DETECTED NOT DETECTED Final   Shigella/Enteroinvasive E coli (EIEC) NOT DETECTED NOT DETECTED Final   Cryptosporidium NOT DETECTED NOT DETECTED Final   Cyclospora cayetanensis NOT DETECTED NOT DETECTED Final   Entamoeba histolytica NOT DETECTED NOT DETECTED Final   Giardia lamblia NOT DETECTED NOT DETECTED Final   Adenovirus F40/41 NOT DETECTED NOT DETECTED Final   Astrovirus NOT DETECTED NOT DETECTED Final   Norovirus GI/GII NOT DETECTED NOT DETECTED Final   Rotavirus A  NOT DETECTED NOT DETECTED Final   Sapovirus (I, II, IV, and V) NOT DETECTED NOT DETECTED Final    Comment: Performed at [facility], [address] [place], [place] [address]  C difficile quick scan w PCR reflex     Status: None   Collection Time: 09/07/19  9:05 AM   Specimen: STOOL  Result Value Ref Range Status   C Diff antigen NEGATIVE NEGATIVE Final   C Diff toxin NEGATIVE NEGATIVE Final   C Diff interpretation No C. difficile detected.  Final    Comment: Performed at [facility], [address] [place], [place] [address]         Radiology Studies:       Scheduled Meds: . feeding supplement  1 Container Oral TID BM  . heparin  5,000 Units Subcutaneous Q8H  . levofloxacin  750 mg Oral Daily  . multivitamin with minerals  1 tablet Oral Daily   Continuous Infusions: . 0.9 % NaCl with KCl 20 mEq / L 125 mL/hr at 09/09/19 1004     LOS: 4 days    Time spent: 30 minutes    Nancy Marus, MD [facility] Pager [phone]  If 7PM-7AM, please contact night-coverage [email or web address] Password [email or web address] 09/09/2019, 1:56 PM    Physician Discharge Summary  Rebecca Huber HYQ:[id number] DOB: Mar 20, 1961 DOA: 09/05/2019  PCP: Patient, No Pcp Per  Admit date: 09/05/2019 Discharge date: 09/09/2019  Admitted From: HomeDisposition:Home  Recommendations for Outpatient Follow-up:  1. Follow up with PCP in 1-2 weeks 2. Please obtain BMP/CBC in one week 3. Please follow up on the following pending results:  Home Health:No Equipment/Devices  Discharge ConditionStable CODE STATUS:Full Diet recommendation: Heart healthy  Brief/Interim Summary: Admitted with acute kidney injury.  He developed diarrhea which was noted to be positive for Salmonella and E. coli.  She was put on Levaquin.  Her diarrhea subsided.  She has no abdominal pain she is feeling better and is eager to go home.  Discharge Diagnoses:  Active Problems:   AKI (acute kidney injury) (HCC)   Salmonella enteritis   Enteropathogenic Escherichia coli infection    Discharge Instructions #1 frequent small meals.  #2 adequate hydration with plan fluids #3 advance activity slowly #4 follow-up with your PCP in the next week #5 call for any worsening of condition especially with fever abdominal pain nausea vomiting or diarrhea.     No Known Allergies  Consultation:   Procedures/Studies:   Subjective:   Discharge Exam: Vitals:   09/09/19 1342 09/09/19 1343  BP:  (!) 176/92  Pulse:  87  Resp:  16  Temp: 98.6 F (37 C)   SpO2:  100%   Vitals:   09/09/19 0542 09/09/19 0827 09/09/19 1342 09/09/19 1343  BP: (!) 185/91 (!) 178/91  (!) 176/92  Pulse: 79   87  Resp:    16  Temp:   98.6 F (37 C)   TempSrc:   Oral   SpO2:    100%  Weight:      Height:        General: Pt is alert, awake, not in acute distress Cardiovascular: RRR, S1/S2 +, no rubs, no gallops Respiratory: CTA bilaterally, no wheezing, no rhonchi Abdominal: Soft, NT, ND, bowel sounds + Extremities: no edema, no cyanosis    The results of significant diagnostics from  this hospitalization (including imaging, microbiology, ancillary and laboratory) are listed below for reference.     Microbiology: Recent Results (from the  past 240 hour(s))  SARS CORONAVIRUS 2 (TAT 6-24 HRS) Nasopharyngeal Nasopharyngeal Swab     Status: None   Collection Time: 09/06/19  1:14 AM   Specimen: Nasopharyngeal Swab  Result Value Ref Range Status   SARS Coronavirus 2 NEGATIVE NEGATIVE Final    Comment: (NOTE) SARS-CoV-2 target nucleic acids are NOT DETECTED. The SARS-CoV-2 RNA is generally detectable in upper and lower respiratory specimens during the acute phase of infection. Negative results do not preclude SARS-CoV-2 infection, do not rule out co-infections with other pathogens, and should not be used as the sole basis for treatment or other patient management decisions. Negative results must be combined with clinical observations, patient history, and epidemiological information. The expected result is Negative. Fact Sheet for Patients: SugarRoll.be Fact Sheet for Healthcare Providers: https://www.woods-mathews.com/ This test is not yet approved or cleared by the [place] FDA and  has been authorized for detection and/or diagnosis of SARS-CoV-2 by FDA under an Emergency Use Authorization (EUA). This EUA will remain  in effect (meaning this test can be used) for the duration of the COVID-19 declaration under Section 56 4(b)(1) of the Act, 21 U.S.C. section 360bbb-3(b)(1), unless the authorization is terminated or revoked sooner. Performed at [facility], [place] [address], [place], [place] [address]   Gastrointestinal Panel by PCR , Stool     Status: Abnormal   Collection Time: 09/07/19  9:05 AM   Specimen: STOOL  Result Value Ref Range Status   Campylobacter species NOT DETECTED NOT DETECTED Final   Plesimonas shigelloides NOT DETECTED NOT DETECTED Final   Salmonella species DETECTED (A) NOT DETECTED Final     Comment: RESULT CALLED TO, READ BACK BY AND VERIFIED WITH: BETH PASSMORE AT 1335 09/07/2019.PMF    Yersinia enterocolitica NOT DETECTED NOT DETECTED Final   Vibrio species NOT DETECTED NOT DETECTED Final   Vibrio cholerae NOT DETECTED NOT DETECTED Final   Enteroaggregative E coli (EAEC) NOT DETECTED NOT DETECTED Final   Enteropathogenic E coli (EPEC) DETECTED (A) NOT DETECTED Final    Comment: RESULT CALLED TO, READ BACK BY AND VERIFIED WITH: BETH PASSMORE AT 1335 09/07/2019.PMF    Enterotoxigenic E coli (ETEC) NOT DETECTED NOT DETECTED Final   Shiga like toxin producing E coli (STEC) NOT DETECTED NOT DETECTED Final   Shigella/Enteroinvasive E coli (EIEC) NOT DETECTED NOT DETECTED Final   Cryptosporidium NOT DETECTED NOT DETECTED Final   Cyclospora cayetanensis NOT DETECTED NOT DETECTED Final   Entamoeba histolytica NOT DETECTED NOT DETECTED Final   Giardia lamblia NOT DETECTED NOT DETECTED Final   Adenovirus F40/41 NOT DETECTED NOT DETECTED Final   Astrovirus NOT DETECTED NOT DETECTED Final   Norovirus GI/GII NOT DETECTED NOT DETECTED Final   Rotavirus A NOT DETECTED NOT DETECTED Final   Sapovirus (I, II, IV, and V) NOT DETECTED NOT DETECTED Final    Comment: Performed at [facility], [address] [place], [place] [address]  C difficile quick scan w PCR reflex     Status: None   Collection Time: 09/07/19  9:05 AM   Specimen: STOOL  Result Value Ref Range Status   C Diff antigen NEGATIVE NEGATIVE Final   C Diff toxin NEGATIVE NEGATIVE Final   C Diff interpretation No C. difficile detected.  Final    Comment: Performed at [facility], [place] [address], [place], [place] [address]     Labs: BNP (last 3 results) No results for input(s): BNP in the last 8760 hours. Basic Metabolic Panel:  Recent Labs  Lab 09/05/19 2142 09/05/19 2332 09/06/19 0041 09/06/19 0530 09/07/19 0735 09/08/19 0513 09/09/19 0520  NA 135 135  --  136 137 141 142  K 2.9*  2.7*  --  3.0* 2.3* 3.3* 3.7  CL 101 104  --  105 100 110 115*  CO2 12* 13*  --  14* 24 23 21*  GLUCOSE 127* 107*  --  105* 139* 92 96  BUN 66* 65*  --  64* 42* 21* 9  CREATININE 5.00* 4.59*  --  4.23* 1.94* 1.29* 0.93  CALCIUM 8.9 8.0* 8.1* 8.0* 7.7* 7.7* 7.9*  MG 2.3  --   --   --   --   --   --   PHOS 9.0*  --   --   --   --   --   --    Liver Function Tests: Recent Labs  Lab 09/05/19 2142 09/06/19 0530 09/09/19 0520  AST 26 24 16   ALT 16 13 11   ALKPHOS 55 46 38  BILITOT 0.7 0.4 0.6  PROT 9.2* 7.5 5.9*  ALBUMIN 4.2 3.5 2.8*   Recent Labs  Lab 09/05/19 2142 09/06/19 0530 09/09/19 0520  LIPASE 215* 332* 273*   No results for input(s): AMMONIA in the last 168 hours. CBC: Recent Labs  Lab 09/05/19 2142 09/06/19 0827 09/09/19 0520  WBC 8.2 6.6 6.6  HGB 14.8 12.9 10.9*  HCT 44.8 38.8 33.7*  MCV 77.5* 77.3* 79.1*  PLT 253 204 214   Cardiac Enzymes: No results for input(s): CKTOTAL, CKMB, CKMBINDEX, TROPONINI in the last 168 hours. BNP: Invalid input(s): POCBNP CBG: No results for input(s): GLUCAP in the last 168 hours. D-Dimer No results for input(s): DDIMER in the last 72 hours. Hgb A1c No results for input(s): HGBA1C in the last 72 hours. Lipid Profile No results for input(s): CHOL, HDL, LDLCALC, TRIG, CHOLHDL, LDLDIRECT in the last 72 hours. Thyroid function studies No results for input(s): TSH, T4TOTAL, T3FREE, THYROIDAB in the last 72 hours.  Invalid input(s): FREET3 Anemia work up No results for input(s): VITAMINB12, FOLATE, FERRITIN, TIBC, IRON, RETICCTPCT in the last 72 hours. Urinalysis    Component Value Date/Time   COLORURINE YELLOW 09/05/2019 2142   APPEARANCEUR HAZY (A) 09/05/2019 2142   LABSPEC 1.013 09/05/2019 2142   PHURINE 5.0 09/05/2019 2142   GLUCOSEU NEGATIVE 09/05/2019 2142   HGBUR NEGATIVE 09/05/2019 2142   BILIRUBINUR NEGATIVE 09/05/2019 2142   KETONESUR NEGATIVE 09/05/2019 2142   PROTEINUR 30 (A) 09/05/2019 2142   NITRITE  NEGATIVE 09/05/2019 2142   LEUKOCYTESUR NEGATIVE 09/05/2019 2142   Sepsis Labs Invalid input(s): PROCALCITONIN,  WBC,  LACTICIDVEN Microbiology Recent Results (from the past 240 hour(s))  SARS CORONAVIRUS 2 (TAT 6-24 HRS) Nasopharyngeal Nasopharyngeal Swab     Status: None   Collection Time: 09/06/19  1:14 AM   Specimen: Nasopharyngeal Swab  Result Value Ref Range Status   SARS Coronavirus 2 NEGATIVE NEGATIVE Final    Comment: (NOTE) SARS-CoV-2 target nucleic acids are NOT DETECTED. The SARS-CoV-2 RNA is generally detectable in upper and lower respiratory specimens during the acute phase of infection. Negative results do not preclude SARS-CoV-2 infection, do not rule out co-infections with other pathogens, and should not be used as the sole basis for treatment or other patient management decisions. Negative results must be combined with clinical observations, patient history, and epidemiological information. The expected result is Negative. Fact Sheet for Patients: 2143 Fact Sheet for Healthcare Providers: 09/08/19 This test is not yet  approved or cleared by the [place] and  has been authorized for detection and/or diagnosis of SARS-CoV-2 by FDA under an Emergency Use Authorization (EUA). This EUA will remain  in effect (meaning this test can be used) for the duration of the COVID-19 declaration under Section 56 4(b)(1) of the Act, 21 U.S.C. section 360bbb-3(b)(1), unless the authorization is terminated or revoked sooner. Performed at [facility], [address] [place], [place] [address]   Gastrointestinal Panel by PCR , Stool     Status: Abnormal   Collection Time: 09/07/19  9:05 AM   Specimen: STOOL  Result Value Ref Range Status   Campylobacter species NOT DETECTED NOT DETECTED Final   Plesimonas shigelloides NOT DETECTED NOT DETECTED Final   Salmonella species DETECTED (A) NOT  DETECTED Final    Comment: RESULT CALLED TO, READ BACK BY AND VERIFIED WITH: BETH PASSMORE AT 1335 09/07/2019.PMF    Yersinia enterocolitica NOT DETECTED NOT DETECTED Final   Vibrio species NOT DETECTED NOT DETECTED Final   Vibrio cholerae NOT DETECTED NOT DETECTED Final   Enteroaggregative E coli (EAEC) NOT DETECTED NOT DETECTED Final   Enteropathogenic E coli (EPEC) DETECTED (A) NOT DETECTED Final    Comment: RESULT CALLED TO, READ BACK BY AND VERIFIED WITH: BETH PASSMORE AT 1335 09/07/2019.PMF    Enterotoxigenic E coli (ETEC) NOT DETECTED NOT DETECTED Final   Shiga like toxin producing E coli (STEC) NOT DETECTED NOT DETECTED Final   Shigella/Enteroinvasive E coli (EIEC) NOT DETECTED NOT DETECTED Final   Cryptosporidium NOT DETECTED NOT DETECTED Final   Cyclospora cayetanensis NOT DETECTED NOT DETECTED Final   Entamoeba histolytica NOT DETECTED NOT DETECTED Final   Giardia lamblia NOT DETECTED NOT DETECTED Final   Adenovirus F40/41 NOT DETECTED NOT DETECTED Final   Astrovirus NOT DETECTED NOT DETECTED Final   Norovirus GI/GII NOT DETECTED NOT DETECTED Final   Rotavirus A NOT DETECTED NOT DETECTED Final   Sapovirus (I, II, IV, and V) NOT DETECTED NOT DETECTED Final    Comment: Performed at [facility], [address] [place], [place] [address]  C difficile quick scan w PCR reflex     Status: None   Collection Time: 09/07/19  9:05 AM   Specimen: STOOL  Result Value Ref Range Status   C Diff antigen NEGATIVE NEGATIVE Final   C Diff toxin NEGATIVE NEGATIVE Final   C Diff interpretation No C. difficile detected.  Final    Comment: Performed at [facility], [address] [place], [place] [address]     Time coordinating discharge: Over 30 minutes  SIGNED:   Nancy Marus, MD  [facility] 09/09/2019, 1:57 PM Pager   If 7PM-7AM, please contact night-coverage [email or web address] Password [email or web address]

## 2019-09-11 LAB — O&P RESULT

## 2019-09-11 LAB — OVA + PARASITE EXAM

## 2019-09-12 LAB — FANA STAINING PATTERNS: Nuclear Dot Pattern: 1:1280 {titer} — ABNORMAL HIGH

## 2019-09-12 LAB — ANTINUCLEAR ANTIBODIES, IFA: ANA Ab, IFA: POSITIVE — AB

## 2019-11-22 ENCOUNTER — Ambulatory Visit (INDEPENDENT_AMBULATORY_CARE_PROVIDER_SITE_OTHER): Payer: 59 | Admitting: Primary Care

## 2019-11-22 ENCOUNTER — Other Ambulatory Visit: Payer: Self-pay

## 2019-11-22 ENCOUNTER — Encounter (INDEPENDENT_AMBULATORY_CARE_PROVIDER_SITE_OTHER): Payer: Self-pay | Admitting: Primary Care

## 2019-11-22 VITALS — BP 211/100 | HR 91 | Temp 97.2°F | Ht 67.0 in | Wt 194.2 lb

## 2019-11-22 DIAGNOSIS — E876 Hypokalemia: Secondary | ICD-10-CM | POA: Diagnosis not present

## 2019-11-22 DIAGNOSIS — Z0000 Encounter for general adult medical examination without abnormal findings: Secondary | ICD-10-CM

## 2019-11-22 DIAGNOSIS — Z7689 Persons encountering health services in other specified circumstances: Secondary | ICD-10-CM

## 2019-11-22 DIAGNOSIS — Z114 Encounter for screening for human immunodeficiency virus [HIV]: Secondary | ICD-10-CM

## 2019-11-22 DIAGNOSIS — Z1231 Encounter for screening mammogram for malignant neoplasm of breast: Secondary | ICD-10-CM

## 2019-11-22 DIAGNOSIS — I10 Essential (primary) hypertension: Secondary | ICD-10-CM | POA: Diagnosis not present

## 2019-11-22 DIAGNOSIS — D508 Other iron deficiency anemias: Secondary | ICD-10-CM

## 2019-11-22 DIAGNOSIS — Z1211 Encounter for screening for malignant neoplasm of colon: Secondary | ICD-10-CM

## 2019-11-22 DIAGNOSIS — Z1159 Encounter for screening for other viral diseases: Secondary | ICD-10-CM

## 2019-11-22 DIAGNOSIS — Z13220 Encounter for screening for lipoid disorders: Secondary | ICD-10-CM

## 2019-11-22 MED ORDER — HYDROCHLOROTHIAZIDE 25 MG PO TABS: 25.0000 mg | ORAL_TABLET | Freq: Every day | ORAL | 1 refills | Status: DC

## 2019-11-22 MED ORDER — AMLODIPINE BESYLATE 10 MG PO TABS: 10.0000 mg | ORAL_TABLET | Freq: Every day | ORAL | 1 refills | Status: DC

## 2019-11-22 MED ORDER — CLONIDINE HCL 0.1 MG PO TABS
0.1000 mg | ORAL_TABLET | Freq: Once | ORAL | Status: AC
  Administered 2019-11-22: 0.1 mg via ORAL

## 2019-11-22 NOTE — Progress Notes (Signed)
New Patient Office Visit  Subjective:  Patient ID: Rebecca Huber, female    DOB: 1961-07-14  Age: 59 y.o. MRN: [id number]  CC:  Chief Complaint  Patient presents with  . New Patient (Initial Visit)    HPI Rebecca Huber presents for establishment of care. She is concerned about elevated blood pressure and out of medications. She is unable to check Bp at home. Denies shortness of breath, headaches, chest pain or lower extremity edema.   History reviewed. No pertinent past medical history.   History reviewed. No pertinent family history.  Social History   Socioeconomic History  . Marital status: Married    Spouse name: Not on file  . Number of children: Not on file  . Years of education: Not on file  . Highest education level: Not on file  Occupational History  . Not on file  Tobacco Use  . Smoking status: Never Smoker  . Smokeless tobacco: Never Used  Substance and Sexual Activity  . Alcohol use: Yes  . Drug use: Never  . Sexual activity: Not Currently  Other Topics Concern  . Not on file  Social History Narrative  . Not on file   Social Determinants of Health   Financial Resource Strain:   . Difficulty of Paying Living Expenses: Not on file  Food Insecurity:   . Worried About Programme researcher, broadcasting/film/video in the Last Year: Not on file  . Ran Out of Food in the Last Year: Not on file  Transportation Needs:   . Lack of Transportation (Medical): Not on file  . Lack of Transportation (Non-Medical): Not on file  Physical Activity:   . Days of Exercise per Week: Not on file  . Minutes of Exercise per Session: Not on file  Stress:   . Feeling of Stress : Not on file  Social Connections:   . Frequency of Communication with Friends and Family: Not on file  . Frequency of Social Gatherings with Friends and Family: Not on file  . Attends Religious Services: Not on file  . Active Member of Clubs or Organizations: Not on file  . Attends Banker Meetings: Not on  file  . Marital Status: Not on file  Intimate Partner Violence:   . Fear of Current or Ex-Partner: Not on file  . Emotionally Abused: Not on file  . Physically Abused: Not on file  . Sexually Abused: Not on file    ROS Review of Systems  All other systems reviewed and are negative.   Objective:   Today's Vitals: BP (!) 211/100 (BP Location: Right Arm, Patient Position: Sitting, Cuff Size: Large)   Pulse 91   Temp (!) 97.2 F (36.2 C) (Temporal)   Ht 5\' 7"  (1.702 m)   Wt 194 lb 3.2 oz (88.1 kg)   SpO2 98%   BMI 30.42 kg/m   Physical Exam Vitals reviewed.  Constitutional:      Appearance: Normal appearance. She is obese.  HENT:     Head: Normocephalic.     Right Ear: Tympanic membrane normal.     Left Ear: Tympanic membrane normal.     Nose: Nose normal.  Cardiovascular:     Rate and Rhythm: Normal rate and regular rhythm.  Pulmonary:     Effort: Pulmonary effort is normal.     Breath sounds: Normal breath sounds.  Abdominal:     General: Bowel sounds are normal.  Musculoskeletal:        General: Normal  range of motion.     Cervical back: Normal range of motion and neck supple.  Skin:    General: Skin is warm and dry.  Neurological:     General: No focal deficit present.     Mental Status: She is alert and oriented to person, place, and time.  Psychiatric:        Mood and Affect: Mood normal.        Behavior: Behavior normal.        Thought Content: Thought content normal.        Judgment: Judgment normal.     Assessment & Plan:  [name] was seen today for new patient (initial visit).  Diagnoses and all orders for this visit:  Encounter to establish care Juluis Mire, NP-C will be your  (PCP) she is mastered prepared . She is skilled to diagnosed and treat illness. Also able to answer health concern as well as continuing care of varied medical conditions, not limited by cause, organ system, or diagnosis.    Essential hypertension Counseled on blood  pressure goal of less than 130/80, low-sodium, DASH diet, medication compliance, 150 minutes of moderate intensity exercise per week. Discussed medication compliance, adverse effects. -     cloNIDine (CATAPRES) tablet 0.1 mg -     Comprehensive metabolic panel; Future  Hypokalemia Potassium Content of Foods  The body needs potassium to control blood pressure and to keep the muscles and nervous system healthy. Here are some healthy foods below that are high in potassium. Also you can get the white label salt of "NO SALT" salt substitute, 1/4 teaspoon of this is equivalent to 70meq potassium.   Lipid screening Risk factors obesity and HTN   Class 2 severe obesity due to excess calories with serious comorbidity in adult, unspecified BMI ([place]) Obesity is 30-39 indicating an excess in caloric intake or underlining conditions. This may lead to other co-morbidities examples are diabetes, HTN, CVD and respiratory complications Lifestyle modifications of diet and exercise may reduce obesity.  -     Lipid Panel; Future  Other orders -     amLODipine (NORVASC) 10 MG tablet; Take 1 tablet (10 mg total) by mouth daily. -     hydrochlorothiazide (HYDRODIURIL) 25 MG tablet; Take 1 tablet (25 mg total) by mouth daily.    Outpatient Encounter Medications as of 11/22/2019  Medication Sig  . amLODipine (NORVASC) 10 MG tablet Take 1 tablet (10 mg total) by mouth daily.  . hydrALAZINE (APRESOLINE) 25 MG tablet Take 1 tablet (25 mg total) by mouth 2 (two) times daily.  . hydrochlorothiazide (HYDRODIURIL) 25 MG tablet Take 1 tablet (25 mg total) by mouth daily.  . [EXPIRED] cloNIDine (CATAPRES) tablet 0.1 mg    No facility-administered encounter medications on file as of 11/22/2019.    Follow-up: Return in about 4 weeks (around 12/20/2019) for pap and blood pressure follow up 3:50 M/T/W (in person).   Kerin Perna, NP

## 2019-11-22 NOTE — Patient Instructions (Addendum)
Managing Your Hypertension Hypertension is commonly called high blood pressure. This is when the force of your blood pressing against the walls of your arteries is too strong. Arteries are blood vessels that carry blood from your heart throughout your body. Hypertension forces the heart to work harder to pump blood, and may cause the arteries to become narrow or stiff. Having untreated or uncontrolled hypertension can cause heart attack, stroke, kidney disease, and other problems. What are blood pressure readings? A blood pressure reading consists of a higher number over a lower number. Ideally, your blood pressure should be below 120/80. The first ("top") number is called the systolic pressure. It is a measure of the pressure in your arteries as your heart beats. The second ("bottom") number is called the diastolic pressure. It is a measure of the pressure in your arteries as the heart relaxes. What does my blood pressure reading mean? Blood pressure is classified into four stages. Based on your blood pressure reading, your health care provider may use the following stages to determine what type of treatment you need, if any. Systolic pressure and diastolic pressure are measured in a unit called mm Hg. Normal  Systolic pressure: below 120.  Diastolic pressure: below 80. Elevated  Systolic pressure: 120-129.  Diastolic pressure: below 80. Hypertension stage 1  Systolic pressure: 130-139.  Diastolic pressure: 80-89. Hypertension stage 2  Systolic pressure: 140 or above.  Diastolic pressure: 90 or above. What health risks are associated with hypertension? Managing your hypertension is an important responsibility. Uncontrolled hypertension can lead to:  A heart attack.  A stroke.  A weakened blood vessel (aneurysm).  Heart failure.  Kidney damage.  Eye damage.  Metabolic syndrome.  Memory and concentration problems. What changes can I make to manage my  hypertension? Hypertension can be managed by making lifestyle changes and possibly by taking medicines. Your health care provider will help you make a plan to bring your blood pressure within a normal range. Eating and drinking   Eat a diet that is high in fiber and potassium, and low in salt (sodium), added sugar, and fat. An example eating plan is called the DASH (Dietary Approaches to Stop Hypertension) diet. To eat this way: ? Eat plenty of fresh fruits and vegetables. Try to fill half of your plate at each meal with fruits and vegetables. ? Eat whole grains, such as whole wheat pasta, brown rice, or whole grain bread. Fill about one quarter of your plate with whole grains. ? Eat low-fat diary products. ? Avoid fatty cuts of meat, processed or cured meats, and poultry with skin. Fill about one quarter of your plate with lean proteins such as fish, chicken without skin, beans, eggs, and tofu. ? Avoid premade and processed foods. These tend to be higher in sodium, added sugar, and fat.  Reduce your daily sodium intake. Most people with hypertension should eat less than 1,500 mg of sodium a day.  Limit alcohol intake to no more than 1 drink a day for nonpregnant women and 2 drinks a day for men. One drink equals 12 oz of beer, 5 oz of wine, or 1 oz of hard liquor. Lifestyle  Work with your health care provider to maintain a healthy body weight, or to lose weight. Ask what an ideal weight is for you.  Get at least 30 minutes of exercise that causes your heart to beat faster (aerobic exercise) most days of the week. Activities may include walking, swimming, or biking.  Include exercise   to strengthen your muscles (resistance exercise), such as weight lifting, as part of your weekly exercise routine. Try to do these types of exercises for 30 minutes at least 3 days a week.  Do not use any products that contain nicotine or tobacco, such as cigarettes and e-cigarettes. If you need help quitting,  ask your health care provider.  Control any long-term (chronic) conditions you have, such as high cholesterol or diabetes. Monitoring  Monitor your blood pressure at home as told by your health care provider. Your personal target blood pressure may vary depending on your medical conditions, your age, and other factors.  Have your blood pressure checked regularly, as often as told by your health care provider. Working with your health care provider  Review all the medicines you take with your health care provider because there may be side effects or interactions.  Talk with your health care provider about your diet, exercise habits, and other lifestyle factors that may be contributing to hypertension.  Visit your health care provider regularly. Your health care provider can help you create and adjust your plan for managing hypertension. Will I need medicine to control my blood pressure? Your health care provider may prescribe medicine if lifestyle changes are not enough to get your blood pressure under control, and if:  Your systolic blood pressure is 130 or higher.  Your diastolic blood pressure is 80 or higher. Take medicines only as told by your health care provider. Follow the directions carefully. Blood pressure medicines must be taken as prescribed. The medicine does not work as well when you skip doses. Skipping doses also puts you at risk for problems. Contact a health care provider if:  You think you are having a reaction to medicines you have taken.  You have repeated (recurrent) headaches.  You feel dizzy.  You have swelling in your ankles.  You have trouble with your vision. Get help right away if:  You develop a severe headache or confusion.  You have unusual weakness or numbness, or you feel faint.  You have severe pain in your chest or abdomen.  You vomit repeatedly.  You have trouble breathing. Summary  Hypertension is when the force of blood pumping  through your arteries is too strong. If this condition is not controlled, it may put you at risk for serious complications.  Your personal target blood pressure may vary depending on your medical conditions, your age, and other factors. For most people, a normal blood pressure is less than 120/80.  Hypertension is managed by lifestyle changes, medicines, or both. Lifestyle changes include weight loss, eating a healthy, low-sodium diet, exercising more, and limiting alcohol. This information is not intended to replace advice given to you by your health care provider. Make sure you discuss any questions you have with your health care provider. Document Revised: 01/19/2019 Document Reviewed: 08/25/2016 Elsevier Patient Education  [place]. Potassium Content of Foods  The body needs potassium to control blood pressure and to keep the muscles and nervous system healthy. Here are some healthy foods below that are high in potassium. Also you can get the white label salt of "NO SALT" salt substitute, 1/4 teaspoon of this is equivalent to 13meq potassium.   FOODS AND DRINKS HIGH IN POTASSIUM FOODS MODERATE IN POTASSIUM   Fruits  Avocado (cubed),  c / 50 g.  Banana (sliced), 75 g.  Cantaloupe (cubed), 80 g.  Honeydew, 1 wedge / 85 g.  Kiwi (sliced), 90 g.  Nectarine, 1  small / 129 g.  Orange, 1 medium / 131 g. Vegetables  Artichoke,  of a medium / 64 g.  Asparagus (boiled), 90 g..  Broccoli (boiled), 78 g.  Brussels sprout (boiled), 78 g.  Butternut squash (baked), 103 g.  Chickpea (cooked), 82 g.  Green peas (cooked), 80 g.  Kidney beans (cooked), 5 tbsp / 55 g.  Lima beans (cooked),  c / 43 g.  Navy beans (cooked),  c / 61 g.  Spinach (cooked),  c / 45 g.  Sweet potato (baked),  c / 50 g.  Tomato (chopped or sliced), 90 g.  Vegetable juice.  White mushrooms (cooked), 78 g.  Yam (cooked or baked),  c / 34 g.  Zucchini squash (boiled), 90 g. Other  Foods and Drinks  Almonds (whole),  c / 36 g.  Fish, 3 oz / 85 g.  Nonfat fruit variety yogurt, 123 g.  Pistachio nuts, 1 oz / 28 g.  Pumpkin seeds, 1 oz / 28 g.  Red meat (broiled, cooked, grilled), 3 oz / 85 g.  Scallops (steamed), 3 oz / 85 g.  Spaghetti sauce,  c / 66 g.  Sunflower seeds (dry roasted), 1 oz / 28 g.  Veggie burger, 1 patty / 70 g. Fruits  Grapefruit,  of the fruit / 123 g  Plums (sliced), 83 g.  Tangerine, 1 large / 120 g. Vegetables  Carrots (boiled), 78 g.  Carrots (sliced), 61 g.  Rhubarb (cooked with sugar), 120 g.  Rutabaga (cooked), 120 g.  Yellow snap beans (cooked), 63 g. Other Foods and Drinks   Chicken breast (roasted and chopped),  c / 70 g.  Pita bread, 1 large / 64 g.  Shrimp (steamed), 4 oz / 113 g.  Swiss cheese (diced), 70 g.

## 2019-11-23 ENCOUNTER — Ambulatory Visit (INDEPENDENT_AMBULATORY_CARE_PROVIDER_SITE_OTHER): Payer: 59

## 2019-11-23 DIAGNOSIS — Z23 Encounter for immunization: Secondary | ICD-10-CM | POA: Diagnosis not present

## 2019-12-19 ENCOUNTER — Encounter (INDEPENDENT_AMBULATORY_CARE_PROVIDER_SITE_OTHER): Payer: Self-pay | Admitting: Primary Care

## 2019-12-19 ENCOUNTER — Other Ambulatory Visit: Payer: Self-pay

## 2019-12-19 ENCOUNTER — Ambulatory Visit (INDEPENDENT_AMBULATORY_CARE_PROVIDER_SITE_OTHER): Payer: 59 | Admitting: Primary Care

## 2019-12-19 ENCOUNTER — Other Ambulatory Visit (HOSPITAL_COMMUNITY)
Admission: RE | Admit: 2019-12-19 | Discharge: 2019-12-19 | Disposition: A | Discharge status: Home / Self Care | Payer: 59 | Source: Ambulatory Visit | Attending: Primary Care | Admitting: Primary Care

## 2019-12-19 VITALS — BP 169/88 | HR 111 | Temp 97.2°F | Ht 67.0 in | Wt 188.2 lb

## 2019-12-19 DIAGNOSIS — Z124 Encounter for screening for malignant neoplasm of cervix: Secondary | ICD-10-CM | POA: Diagnosis not present

## 2019-12-19 DIAGNOSIS — Z113 Encounter for screening for infections with a predominantly sexual mode of transmission: Secondary | ICD-10-CM | POA: Diagnosis not present

## 2019-12-19 DIAGNOSIS — I10 Essential (primary) hypertension: Secondary | ICD-10-CM

## 2019-12-19 DIAGNOSIS — Z1211 Encounter for screening for malignant neoplasm of colon: Secondary | ICD-10-CM | POA: Diagnosis not present

## 2019-12-19 MED ORDER — HYDRALAZINE HCL 25 MG PO TABS: 25.0000 mg | ORAL_TABLET | Freq: Two times a day (BID) | ORAL | 1 refills | Status: DC

## 2019-12-19 NOTE — Patient Instructions (Signed)

## 2019-12-19 NOTE — Progress Notes (Signed)
Established Patient Office Visit  Subjective:  Patient ID: Rebecca Huber, female    DOB: May 20, 1961  Age: 59 y.o. MRN: [id number]  CC:  Chief Complaint  Patient presents with  . Gynecologic Exam  . Blood Pressure Check    HPI Rebecca Huber presents for preventive care for cervical cancer screening.  History reviewed. No pertinent past medical history.  History reviewed. No pertinent surgical history.  History reviewed. No pertinent family history.  Social History   Socioeconomic History  . Marital status: Married    Spouse name: Not on file  . Number of children: Not on file  . Years of education: Not on file  . Highest education level: Not on file  Occupational History  . Not on file  Tobacco Use  . Smoking status: Never Smoker  . Smokeless tobacco: Never Used  Substance and Sexual Activity  . Alcohol use: Yes  . Drug use: Never  . Sexual activity: Not Currently  Other Topics Concern  . Not on file  Social History Narrative  . Not on file   Social Determinants of Health   Financial Resource Strain:   . Difficulty of Paying Living Expenses: Not on file  Food Insecurity:   . Worried About Programme researcher, broadcasting/film/video in the Last Year: Not on file  . Ran Out of Food in the Last Year: Not on file  Transportation Needs:   . Lack of Transportation (Medical): Not on file  . Lack of Transportation (Non-Medical): Not on file  Physical Activity:   . Days of Exercise per Week: Not on file  . Minutes of Exercise per Session: Not on file  Stress:   . Feeling of Stress : Not on file  Social Connections:   . Frequency of Communication with Friends and Family: Not on file  . Frequency of Social Gatherings with Friends and Family: Not on file  . Attends Religious Services: Not on file  . Active Member of Clubs or Organizations: Not on file  . Attends Banker Meetings: Not on file  . Marital Status: Not on file  Intimate Partner Violence:   . Fear of Current or  Ex-Partner: Not on file  . Emotionally Abused: Not on file  . Physically Abused: Not on file  . Sexually Abused: Not on file    Outpatient Medications Prior to Visit  Medication Sig Dispense Refill  . amLODipine (NORVASC) 10 MG tablet Take 1 tablet (10 mg total) by mouth daily. 90 tablet 1  . hydrochlorothiazide (HYDRODIURIL) 25 MG tablet Take 1 tablet (25 mg total) by mouth daily. 90 tablet 1  . hydrALAZINE (APRESOLINE) 25 MG tablet Take 1 tablet (25 mg total) by mouth 2 (two) times daily. 60 tablet 1   No facility-administered medications prior to visit.    No Known Allergies  ROS Review of Systems  All other systems reviewed and are negative.     Objective:    Physical Exam CONSTITUTIONAL: Well-developed, well-nourished female in no acute distress.  HENT:  Normocephalic, atraumatic, External right and left ear normal. Oropharynx is clear and moist EYES: Conjunctivae and EOM are normal. Pupils are equal, round, and reactive to light. No scleral icterus.  NECK: Normal range of motion, supple, no masses.  Normal thyroid.  SKIN: Skin is warm and dry. No rash noted. Not diaphoretic. No erythema. No pallor. NEUROLGIC: Alert and oriented to person, place, and time. Normal reflexes, muscle tone coordination. No cranial nerve deficit noted. PSYCHIATRIC: Normal  mood and affect. Normal behavior. Normal judgment and thought content. CARDIOVASCULAR: Normal heart rate noted, regular rhythm RESPIRATORY: Clear to auscultation bilaterally. Effort and breath sounds normal, no problems with respiration noted. BREASTS: scheduled mammogram and taught SBE ABDOMEN: Soft, normal bowel sounds, no distention noted.  No tenderness, rebound or guarding.  PELVIC: Normal appearing external genitalia; normal appearing vaginal mucosa and cervix.  No abnormal discharge noted.  Pap smear obtained.  Normal uterine size, no other palpable masses, no uterine or adnexal tenderness. MUSCULOSKELETAL: Normal range  of motion. No tenderness.  No cyanosis, clubbing, or edema.  2+ distal pulses. BP (!) 169/88 (BP Location: Right Arm, Patient Position: Sitting, Cuff Size: Large)   Pulse (!) 111   Temp (!) 97.2 F (36.2 C) (Temporal)   Ht 5\' 7"  (1.702 m)   Wt 188 lb 3.2 oz (85.4 kg)   SpO2 97%   BMI 29.48 kg/m  Wt Readings from Last 3 Encounters:  12/19/19 188 lb 3.2 oz (85.4 kg)  11/22/19 194 lb 3.2 oz (88.1 kg)  09/06/19 185 lb 6.5 oz (84.1 kg)     Health Maintenance Due  Topic Date Due  . Hepatitis C Screening  April 13, 1961  . MAMMOGRAM  02/20/2009  . COLON CANCER SCREENING ANNUAL FOBT  02/20/2009  . PAP SMEAR-Modifier  08/01/2015    There are no preventive care reminders to display for this patient.  Lab Results  Component Value Date   TSH 2.589 09/06/2019   Lab Results  Component Value Date   WBC 6.6 09/09/2019   HGB 10.9 (L) 09/09/2019   HCT 33.7 (L) 09/09/2019   MCV 79.1 (L) 09/09/2019   PLT 214 09/09/2019   Lab Results  Component Value Date   NA 142 09/09/2019   K 3.7 09/09/2019   CO2 21 (L) 09/09/2019   GLUCOSE 96 09/09/2019   BUN 9 09/09/2019   CREATININE 0.93 09/09/2019   BILITOT 0.6 09/09/2019   ALKPHOS 38 09/09/2019   AST 16 09/09/2019   ALT 11 09/09/2019   PROT 5.9 (L) 09/09/2019   ALBUMIN 2.8 (L) 09/09/2019   CALCIUM 7.9 (L) 09/09/2019   ANIONGAP 6 09/09/2019   No results found for: CHOL No results found for: HDL No results found for: LDLCALC No results found for: TRIG No results found for: CHOLHDL Lab Results  Component Value Date   HGBA1C 6.0 (H) 09/06/2019      Assessment & Plan:  Rebecca Huber was seen today for gynecologic exam and blood pressure check.  Diagnoses and all orders for this visit:  Special screening for malignant neoplasms, colon -     Fecal occult blood, imunochemical([facility]); Future  Cervical cancer screening The USPSTF recommendations screening of cervical cancer every 3 years with cervical cytology.   -     Cytology -  PAP  Screening for STD (sexually transmitted disease) -     Cervicovaginal ancillary only  Essential hypertension Blood pressure remains elevated despite changes in diet and trying to exercise will restart hydralazine 25 mg twice daily  Blood pressure goal is less than 130/80, low-sodium, DASH diet, medication compliance, 150 minutes of moderate intensity exercise per week. Discussed medication compliance, adverse effects.  Other orders -     hydrALAZINE (APRESOLINE) 25 MG tablet; Take 1 tablet (25 mg total) by mouth 2 (two) times daily.    Meds ordered this encounter  Medications  . hydrALAZINE (APRESOLINE) 25 MG tablet    Sig: Take 1 tablet (25 mg total) by mouth 2 (two) times  daily.    Dispense:  180 tablet    Refill:  1    Follow-up: Return in about 6 weeks (around 01/30/2020) for IN PERSON- Bp check .    Grayce Sessions, NPce

## 2019-12-21 LAB — CYTOLOGY - PAP: Diagnosis: NEGATIVE

## 2019-12-21 LAB — CERVICOVAGINAL ANCILLARY ONLY
Bacterial Vaginitis (gardnerella): POSITIVE — AB
Candida Glabrata: NEGATIVE
Candida Vaginitis: NEGATIVE
Chlamydia: NEGATIVE
Comment: NEGATIVE
Comment: NEGATIVE
Comment: NEGATIVE
Comment: NEGATIVE
Comment: NEGATIVE
Comment: NORMAL
Neisseria Gonorrhea: NEGATIVE
Trichomonas: NEGATIVE

## 2019-12-21 LAB — FECAL OCCULT BLOOD, IMMUNOCHEMICAL: Fecal Occult Bld: POSITIVE — AB

## 2019-12-24 ENCOUNTER — Other Ambulatory Visit (INDEPENDENT_AMBULATORY_CARE_PROVIDER_SITE_OTHER): Payer: Self-pay | Admitting: Primary Care

## 2019-12-24 DIAGNOSIS — N760 Acute vaginitis: Secondary | ICD-10-CM

## 2019-12-24 DIAGNOSIS — B9689 Other specified bacterial agents as the cause of diseases classified elsewhere: Secondary | ICD-10-CM

## 2019-12-24 DIAGNOSIS — K921 Melena: Secondary | ICD-10-CM

## 2019-12-24 MED ORDER — METRONIDAZOLE 500 MG PO TABS: 500.0000 mg | ORAL_TABLET | Freq: Two times a day (BID) | ORAL | 0 refills | Status: DC

## 2019-12-25 ENCOUNTER — Telehealth (INDEPENDENT_AMBULATORY_CARE_PROVIDER_SITE_OTHER): Payer: Self-pay

## 2019-12-25 NOTE — Telephone Encounter (Signed)
-----   Message from Grayce Sessions, NP sent at 12/24/2019  4:40 PM EDT ----- Colon screening positive will refer to GI.  Cervicovaginal exam showed bacterial vaginosis treat A prescription for metronidazole. You take this medication twice a day for 7 days. Be sure that you do not drink alcohol when you take this medication because the combination can give you severe nausea and vomiting.  It can sometimes give people a metallic taste in their mouth while they are taking it. When you take antibiotics, it can wipe out your gut flora.  This can cause problems like diarrhea.  It would be helpful to restore your gut flora and potentially decrease the side effects if you were to take either an over-the-counter probiotic such as [employer], Sport and exercise psychologist or [employer].  I would recommend taking this on a daily basis for at least 3-4 weeks.

## 2019-12-25 NOTE — Telephone Encounter (Signed)
Left voicemail notifying patient that colon cancer screening was positive; blood was found. Referred to GI. Also informed that she is positive for BV and antibiotics have been sent to her pharmacy. Advised patient to return call to [facility] with any questions or concerns. Maryjean Morn, CMA

## 2020-01-08 ENCOUNTER — Ambulatory Visit
Admission: RE | Admit: 2020-01-08 | Discharge: 2020-01-08 | Disposition: A | Discharge status: Home / Self Care | Payer: 59 | Source: Ambulatory Visit | Attending: Primary Care | Admitting: Primary Care

## 2020-01-08 ENCOUNTER — Other Ambulatory Visit: Payer: Self-pay

## 2020-01-08 DIAGNOSIS — Z1231 Encounter for screening mammogram for malignant neoplasm of breast: Secondary | ICD-10-CM

## 2020-01-10 ENCOUNTER — Other Ambulatory Visit: Payer: Self-pay | Admitting: Primary Care

## 2020-01-10 DIAGNOSIS — R928 Other abnormal and inconclusive findings on diagnostic imaging of breast: Secondary | ICD-10-CM

## 2020-01-25 ENCOUNTER — Ambulatory Visit
Admission: RE | Admit: 2020-01-25 | Discharge: 2020-01-25 | Disposition: A | Discharge status: Home / Self Care | Payer: 59 | Source: Ambulatory Visit | Attending: Primary Care | Admitting: Primary Care

## 2020-01-25 ENCOUNTER — Other Ambulatory Visit: Payer: Self-pay

## 2020-01-25 ENCOUNTER — Other Ambulatory Visit: Payer: Self-pay | Admitting: Primary Care

## 2020-01-25 DIAGNOSIS — R928 Other abnormal and inconclusive findings on diagnostic imaging of breast: Secondary | ICD-10-CM

## 2020-01-30 ENCOUNTER — Ambulatory Visit (INDEPENDENT_AMBULATORY_CARE_PROVIDER_SITE_OTHER): Payer: 59 | Admitting: Primary Care

## 2020-01-30 ENCOUNTER — Other Ambulatory Visit: Payer: Self-pay

## 2020-01-30 ENCOUNTER — Encounter (INDEPENDENT_AMBULATORY_CARE_PROVIDER_SITE_OTHER): Payer: Self-pay | Admitting: Primary Care

## 2020-01-30 VITALS — BP 151/74 | HR 98 | Temp 97.2°F | Ht 67.0 in | Wt 187.4 lb

## 2020-01-30 DIAGNOSIS — Z0130 Encounter for examination of blood pressure without abnormal findings: Secondary | ICD-10-CM

## 2020-01-30 MED ORDER — LOSARTAN POTASSIUM 100 MG PO TABS: 100.0000 mg | ORAL_TABLET | Freq: Every day | ORAL | 1 refills | Status: DC

## 2020-01-30 MED ORDER — HYDRALAZINE HCL 50 MG PO TABS: 50.0000 mg | ORAL_TABLET | Freq: Three times a day (TID) | ORAL | 1 refills | Status: DC

## 2020-01-30 NOTE — Progress Notes (Signed)
  Presents for  hypertension evaluation, on previous visit medication was adjusted to include hydralazine 25mg  twice daily and continue HCTZ 25mg  and amlodipinde 10mg  daily.  Patient reports adherence with medications.  Current Medication List Hydralazine 25mg  twice daily  Amlodipine 10mg  daily HCTZ 25mg   Past Medical History  Hypertension  Dietary habits include: healthy heart diet Exercise habits include: walking daily  Family / Social history: no  ASCVD risk factors include- CHA2DS2-VASc Score =  3 The patient's score is based upon:       Signed,  , NP    01/30/2020 3:35 PM    O:  Physical Exam Vitals reviewed.  Constitutional:      Appearance: She is obese.  HENT:     Head: Normocephalic.  Cardiovascular:     Rate and Rhythm: Normal rate and regular rhythm.  Pulmonary:     Effort: Pulmonary effort is normal.     Breath sounds: Normal breath sounds.  Abdominal:     General: Bowel sounds are normal.  Musculoskeletal:        General: Normal range of motion.     Cervical back: Normal range of motion.  Skin:    General: Skin is warm and dry.  Neurological:     Mental Status: She is alert and oriented to person, place, and time.  Psychiatric:        Mood and Affect: Mood normal.        Behavior: Behavior normal.        Thought Content: Thought content normal.        Judgment: Judgment normal.      Review of Systems  All other systems reviewed and are negative.   Last 3 Office BP readings: BP Readings from Last 3 Encounters:  01/30/20 (!) 151/74  12/19/19 (!) 169/88  11/22/19 (!) 211/100    BMET    Component Value Date/Time   NA 142 09/09/2019 0520   K 3.7 09/09/2019 0520   CL 115 (H) 09/09/2019 0520   CO2 21 (L) 09/09/2019 0520   GLUCOSE 96 09/09/2019 0520   BUN 9 09/09/2019 0520   CREATININE 0.93 09/09/2019 0520   CALCIUM 7.9 (L) 09/09/2019 0520   CALCIUM 8.1 (L) 09/06/2019 0041   GFRNONAA >60 09/09/2019 0520   GFRAA  >60 09/09/2019 0520    Renal function: CrCl cannot be calculated (Patient's most recent lab result is older than the maximum 21 days allowed.).  Clinical ASCVD: Yes  The ASCVD Risk score 09/11/2019 DC Jr., et al., 2013) failed to calculate for the following reasons:   Cannot find a previous HDL lab   Cannot find a previous total cholesterol lab   A/P: Blood pressure check Hypertension longstanding on amlodipine 10mg  , hydralazine 25mg  twice and ,HCTZ 25mg ,  current medications. BP Goal = 130/80 mmHg. Patient is adherent with current medications.  -Started on losartan 100mg  daily, HCTZ 25mg  daily when completed will prescribe combo losartan 100/ HCTZ 25mg  and increase hydralazine 25mg  twice daily to hydralazine 50mg  three times a day  -F/u labs ordered -future  -Counseled on lifestyle modifications for blood pressure control including reduced dietary sodium, increased exercise, adequate sleep

## 2020-01-30 NOTE — Patient Instructions (Addendum)
Medication change take 2 (25mg  ) of Hydralazine three times a day until gone. Than pick up new prescription for   Hydralazine 50mg  three times a day. Added Losartan 100 mg... Call when Losartan and HCTZ 25 mg is completed than will send in a combination losartan/HCTZ- 100/25 daily. Continue taking amlodipine 10mg  daily.

## 2020-02-22 ENCOUNTER — Telehealth (INDEPENDENT_AMBULATORY_CARE_PROVIDER_SITE_OTHER): Payer: Self-pay

## 2020-02-22 NOTE — Telephone Encounter (Signed)
Patient called stating PCP adivce her to call the office before her medication finished so that she can send an RX of a combine a medication. Patient states that her PCP stated she would give her a pill that has two medication so that she would not take 4 different pills.  Patient uses [id number] - [place], [place] - [address]   Please advice [phone]

## 2020-03-12 ENCOUNTER — Other Ambulatory Visit: Payer: Self-pay

## 2020-03-12 ENCOUNTER — Encounter (INDEPENDENT_AMBULATORY_CARE_PROVIDER_SITE_OTHER): Payer: Self-pay | Admitting: Primary Care

## 2020-03-12 ENCOUNTER — Ambulatory Visit (INDEPENDENT_AMBULATORY_CARE_PROVIDER_SITE_OTHER): Payer: 59 | Admitting: Primary Care

## 2020-03-12 VITALS — BP 137/70 | HR 112 | Temp 97.3°F | Ht 67.0 in | Wt 190.4 lb

## 2020-03-12 DIAGNOSIS — I10 Essential (primary) hypertension: Secondary | ICD-10-CM | POA: Diagnosis not present

## 2020-03-12 MED ORDER — LOSARTAN POTASSIUM-HCTZ 100-25 MG PO TABS: 1.0000 | ORAL_TABLET | Freq: Every day | ORAL | 3 refills | Status: DC

## 2020-03-12 NOTE — Patient Instructions (Signed)
discontinue HCTZ 25mg  and Cozzar 100mg  and change to combo losartan/HCTZ 100/25 daily. This will replace taking 2 pill now will take this one and amlodipine daily.   Hypertension, Adult Hypertension is another name for high blood pressure. High blood pressure forces your heart to work harder to pump blood. This can cause problems over time. There are two numbers in a blood pressure reading. There is a top number (systolic) over a bottom number (diastolic). It is best to have a blood pressure that is below 120/80. Healthy choices can help lower your blood pressure, or you may need medicine to help lower it. What are the causes? The cause of this condition is not known. Some conditions may be related to high blood pressure. What increases the risk?  Smoking.  Having type 2 diabetes mellitus, high cholesterol, or both.  Not getting enough exercise or physical activity.  Being overweight.  Having too much fat, sugar, calories, or salt (sodium) in your diet.  Drinking too much alcohol.  Having long-term (chronic) kidney disease.  Having a family history of high blood pressure.  Age. Risk increases with age.  Race. You may be at higher risk if you are African American.  Gender. Men are at higher risk than women before age 2. After age 53, women are at higher risk than men.  Having obstructive sleep apnea.  Stress. What are the signs or symptoms?  High blood pressure may not cause symptoms. Very high blood pressure (hypertensive crisis) may cause: ? Headache. ? Feelings of worry or nervousness (anxiety). ? Shortness of breath. ? Nosebleed. ? A feeling of being sick to your stomach (nausea). ? Throwing up (vomiting). ? Changes in how you see. ? Very bad chest pain. ? Seizures. How is this treated?  This condition is treated by making healthy lifestyle changes, such as: ? Eating healthy foods. ? Exercising more. ? Drinking less alcohol.  Your health care provider may  prescribe medicine if lifestyle changes are not enough to get your blood pressure under control, and if: ? Your top number is above 130. ? Your bottom number is above 80.  Your personal target blood pressure may vary. Follow these instructions at home: Eating and drinking   If told, follow the DASH eating plan. To follow this plan: ? Fill one half of your plate at each meal with fruits and vegetables. ? Fill one fourth of your plate at each meal with whole grains. Whole grains include whole-wheat pasta, brown rice, and whole-grain bread. ? Eat or drink low-fat dairy products, such as skim milk or low-fat yogurt. ? Fill one fourth of your plate at each meal with low-fat (lean) proteins. Low-fat proteins include fish, chicken without skin, eggs, beans, and tofu. ? Avoid fatty meat, cured and processed meat, or chicken with skin. ? Avoid pre-made or processed food.  Eat less than 1,500 mg of salt each day.  Do not drink alcohol if: ? Your doctor tells you not to drink. ? You are pregnant, may be pregnant, or are planning to become pregnant.  If you drink alcohol: ? Limit how much you use to:  0-1 drink a day for women.  0-2 drinks a day for men. ? Be aware of how much alcohol is in your drink. In the [place] one drink equals one 12 oz bottle of beer (355 mL), one 5 oz glass of wine (148 mL), or one 1 oz glass of hard liquor (44 mL). Lifestyle   Work with your doctor  to stay at a healthy weight or to lose weight. Ask your doctor what the best weight is for you.  Get at least 30 minutes of exercise most days of the week. This may include walking, swimming, or biking.  Get at least 30 minutes of exercise that strengthens your muscles (resistance exercise) at least 3 days a week. This may include lifting weights or doing Pilates.  Do not use any products that contain nicotine or tobacco, such as cigarettes, e-cigarettes, and chewing tobacco. If you need help quitting, ask your  doctor.  Check your blood pressure at home as told by your doctor.  Keep all follow-up visits as told by your doctor. This is important. Medicines  Take over-the-counter and prescription medicines only as told by your doctor. Follow directions carefully.  Do not skip doses of blood pressure medicine. The medicine does not work as well if you skip doses. Skipping doses also puts you at risk for problems.  Ask your doctor about side effects or reactions to medicines that you should watch for. Contact a doctor if you:  Think you are having a reaction to the medicine you are taking.  Have headaches that keep coming back (recurring).  Feel dizzy.  Have swelling in your ankles.  Have trouble with your vision. Get help right away if you:  Get a very bad headache.  Start to feel mixed up (confused).  Feel weak or numb.  Feel faint.  Have very bad pain in your: ? Chest. ? Belly (abdomen).  Throw up more than once.  Have trouble breathing. Summary  Hypertension is another name for high blood pressure.  High blood pressure forces your heart to work harder to pump blood.  For most people, a normal blood pressure is less than 120/80.  Making healthy choices can help lower blood pressure. If your blood pressure does not get lower with healthy choices, you may need to take medicine. This information is not intended to replace advice given to you by your health care provider. Make sure you discuss any questions you have with your health care provider. Document Revised: 06/07/2018 Document Reviewed: 06/07/2018 Elsevier Patient Education  2020 [employer].

## 2020-03-12 NOTE — Progress Notes (Signed)
[name] presents for  hypertension evaluation, on previous visit medication was adjusted to include hydralizine 50mg  three times a day .   Patient reports adherence with medications.  Current Medication List Current Outpatient Medications on File Prior to Visit  Medication Sig Dispense Refill  . amLODipine (NORVASC) 10 MG tablet Take 1 tablet (10 mg total) by mouth daily. 90 tablet 1  . hydrALAZINE (APRESOLINE) 50 MG tablet Take 1 tablet (50 mg total) by mouth 3 (three) times daily. 180 tablet 1   No current facility-administered medications on file prior to visit.   Past Medical History  No past medical history on file. Dietary habits include: healthy low sodium diet Exercise habits include:walking daily  Family / Social history:No ASCVD risk factors include-  O:  Physical Exam Vitals reviewed.  Constitutional:      Appearance: She is obese.  Cardiovascular:     Rate and Rhythm: Normal rate and regular rhythm.  Pulmonary:     Effort: Pulmonary effort is normal.     Breath sounds: Normal breath sounds.  Abdominal:     General: Bowel sounds are normal.  Musculoskeletal:        General: Normal range of motion.     Cervical back: Normal range of motion and neck supple.  Skin:    General: Skin is warm and dry.  Neurological:     Mental Status: She is alert and oriented to person, place, and time.  Psychiatric:        Mood and Affect: Mood normal.        Behavior: Behavior normal.        Thought Content: Thought content normal.        Judgment: Judgment normal.      Review of Systems  All other systems reviewed and are negative.   Last 3 Office BP readings: BP Readings from Last 3 Encounters:  03/12/20 137/70  01/30/20 (!) 151/74  12/19/19 (!) 169/88    BMET    Component Value Date/Time   NA 142 09/09/2019 0520   K 3.7 09/09/2019 0520   CL 115 (H) 09/09/2019 0520   CO2 21 (L) 09/09/2019 0520   GLUCOSE 96 09/09/2019 0520   BUN 9 09/09/2019 0520    CREATININE 0.93 09/09/2019 0520   CALCIUM 7.9 (L) 09/09/2019 0520   CALCIUM 8.1 (L) 09/06/2019 0041   GFRNONAA >60 09/09/2019 0520   GFRAA >60 09/09/2019 0520    Renal function: CrCl cannot be calculated (Patient's most recent lab result is older than the maximum 21 days allowed.).  Clinical ASCVD: Yes  The ASCVD Risk score 09/11/2019 DC Jr., et al., 2013) failed to calculate for the following reasons:   Cannot find a previous HDL lab   Cannot find a previous total cholesterol lab   A/P: Rebecca Huber was seen today for hypertension.  Diagnoses and all orders for this visit:  -     Essential hypertension Hypertension longstanding currently amlodipine 10mg , HCTZ 25mg , Cozzar 100mg   on current medications. BP Goal = 130/80 mmHg. Patient is adherent with current medications.  -Continued but discontinue HCTZ 25mg  and Cozzar 100mg  and change to combo losartan/HCTZ 100/25 daily. This will replace taking 2 pill now will take this one and amlodipine daily.  -F/u labs ordered - none -Counseled on lifestyle modifications for blood pressure control including reduced dietary sodium, increased exercise, adequate sleep  losartan-hydrochlorothiazide (HYZAAR) 100-25 MG tablet; Take 1 tablet by mouth daily.

## 2020-05-07 ENCOUNTER — Ambulatory Visit: Payer: 59 | Attending: Critical Care Medicine

## 2020-05-07 DIAGNOSIS — Z23 Encounter for immunization: Secondary | ICD-10-CM

## 2020-05-07 NOTE — Progress Notes (Signed)
   Covid-19 Vaccination Clinic  Name:  Rebecca Huber    MRN: [id number] DOB: [birth year]  05/07/2020  [name] was observed post Covid-19 immunization for 15 minutes without incident. She was provided with Vaccine Information Sheet and instruction to access the V-Safe system.   [name] was instructed to call 911 with any severe reactions post vaccine: Marland Kitchen Difficulty breathing  . Swelling of face and throat  . A fast heartbeat  . A bad rash all over body  . Dizziness and weakness   Immunizations Administered    Name Date Dose VIS Date Route   Moderna COVID-19 Vaccine 05/07/2020  9:56 AM 0.5 mL 09/2019 Intramuscular   Manufacturer: Moderna   Lot: [id number]   NDC: 83254-982-64

## 2020-05-21 ENCOUNTER — Telehealth (INDEPENDENT_AMBULATORY_CARE_PROVIDER_SITE_OTHER): Payer: Self-pay | Admitting: Primary Care

## 2020-05-21 NOTE — Telephone Encounter (Signed)
Spoke with Pt and pharmacy/ the Rx for losartan-hydrochlorothiazide (HYZAAR) 100-25 MG tablet With pts insurance is expensive and pharmacist advised to have medication split to Losartan and Hydrochlorothiazide to lower the cost for the Pt/ please advise

## 2020-05-23 ENCOUNTER — Other Ambulatory Visit (INDEPENDENT_AMBULATORY_CARE_PROVIDER_SITE_OTHER): Payer: Self-pay | Admitting: Primary Care

## 2020-05-23 MED ORDER — HYDROCHLOROTHIAZIDE 25 MG PO TABS: 25.0000 mg | ORAL_TABLET | Freq: Every day | ORAL | 3 refills | Status: DC

## 2020-05-23 MED ORDER — LOSARTAN POTASSIUM 100 MG PO TABS
100.0000 mg | ORAL_TABLET | Freq: Every day | ORAL | 3 refills | Status: DC
Start: 2020-05-23 — End: 2020-07-23

## 2020-05-23 MED FILL — LOSARTAN POTASSIUM 100 MG T: 100 | 30 days supply | Qty: 30 | Fill #0

## 2020-05-23 MED FILL — HYDROCHLOROTHIAZIDE 25 MG T: 25 | 30 days supply | Qty: 30 | Fill #0

## 2020-06-03 ENCOUNTER — Ambulatory Visit: Payer: 59

## 2020-06-04 ENCOUNTER — Ambulatory Visit: Payer: 59

## 2020-06-10 ENCOUNTER — Ambulatory Visit: Payer: Self-pay

## 2020-06-12 ENCOUNTER — Other Ambulatory Visit: Payer: Self-pay

## 2020-06-12 ENCOUNTER — Ambulatory Visit (INDEPENDENT_AMBULATORY_CARE_PROVIDER_SITE_OTHER): Payer: 59 | Admitting: Primary Care

## 2020-06-12 ENCOUNTER — Encounter (INDEPENDENT_AMBULATORY_CARE_PROVIDER_SITE_OTHER): Payer: Self-pay | Admitting: Primary Care

## 2020-06-12 ENCOUNTER — Telehealth (INDEPENDENT_AMBULATORY_CARE_PROVIDER_SITE_OTHER): Payer: Self-pay | Admitting: Primary Care

## 2020-06-12 ENCOUNTER — Encounter (INDEPENDENT_AMBULATORY_CARE_PROVIDER_SITE_OTHER): Payer: Self-pay

## 2020-06-12 VITALS — BP 145/78 | HR 99 | Temp 98.7°F | Resp 16 | Ht 67.0 in | Wt 180.0 lb

## 2020-06-12 DIAGNOSIS — I10 Essential (primary) hypertension: Secondary | ICD-10-CM | POA: Diagnosis not present

## 2020-06-12 NOTE — Telephone Encounter (Signed)
Patient called to inform the doctor that she is currently on the following medications -  amLODipine (NORVASC) 10 MG tablet hydrochlorothiazide (HYDRODIURIL) 25 MG tablet

## 2020-06-12 NOTE — Progress Notes (Signed)
[facility]    [name] is a 59 year old female who presents for  hypertension evaluation, on previous visit medication was adjusted to include losartan/HCTZ 100/25 but due to affordability did not pick up. Sent in separate prescriptions and taking 2 out of the 3 but we are unsure what the 2 are. Patient reports adherence with medications.  Current Medication List Current Outpatient Medications on File Prior to Visit  Medication Sig Dispense Refill  . amLODipine (NORVASC) 10 MG tablet TAKE 1 TABLET(10 MG) BY MOUTH DAILY 90 tablet 1  . hydrochlorothiazide (HYDRODIURIL) 25 MG tablet Take 1 tablet (25 mg total) by mouth daily. 90 tablet 3  . losartan (COZAAR) 100 MG tablet Take 1 tablet (100 mg total) by mouth daily. 90 tablet 3   No current facility-administered medications on file prior to visit.    Past Medical History  No past medical history on file. Dietary habits include: low salt diet  Exercise habits include walking daily  Family / Social history: no  ASCVD risk factors include- [place] The ASCVD Risk score (Goff DC Jr., et al., 2013) failed to calculate for the following reasons:   Cannot find a previous HDL lab   Cannot find a previous total cholesterol lab  O:  Physical Exam Vitals reviewed.  HENT:     Nose: Nose normal.  Cardiovascular:     Rate and Rhythm: Normal rate and regular rhythm.     Pulses: Normal pulses.     Heart sounds: Normal heart sounds.  Pulmonary:     Effort: Pulmonary effort is normal.     Breath sounds: Normal breath sounds.  Abdominal:     General: Bowel sounds are normal.  Musculoskeletal:        General: Normal range of motion.     Cervical back: Normal range of motion.  Neurological:     Mental Status: She is alert and oriented to person, place, and time.  Psychiatric:        Mood and Affect: Mood normal.        Behavior: Behavior normal.        Thought Content: Thought content normal.        Judgment: Judgment  normal.      Review of Systems  All other systems reviewed and are negative.   Last 3 Office BP readings: BP Readings from Last 3 Encounters:  06/12/20 (!) 145/78  03/12/20 137/70  01/30/20 (!) 151/74    BMET    Component Value Date/Time   NA 142 09/09/2019 0520   K 3.7 09/09/2019 0520   CL 115 (H) 09/09/2019 0520   CO2 21 (L) 09/09/2019 0520   GLUCOSE 96 09/09/2019 0520   BUN 9 09/09/2019 0520   CREATININE 0.93 09/09/2019 0520   CALCIUM 7.9 (L) 09/09/2019 0520   CALCIUM 8.1 (L) 09/06/2019 0041   GFRNONAA >60 09/09/2019 0520   GFRAA >60 09/09/2019 0520    Renal function: CrCl cannot be calculated (Patient's most recent lab result is older than the maximum 21 days allowed.).  Clinical ASCVD: Yes  The ASCVD Risk score Denman George DC Jr., et al., 2013) failed to calculate for the following reasons:   Cannot find a previous HDL lab   Cannot find a previous total cholesterol lab   A/P: Essential hypertension Hypertension longstanding diagnosed currently amlodipine 10 mg, hydrochlorothiazide 25 mg, and losartan 100 mg these may or once daily on current medications. BP Goal = 130/80 mmHg. Patient is adherent with  current medications.  -Continued - F/u labs ordered -listed as future from February patient has not come in fasting follow-up appointment will be scheduled for fasting blood work to include CBC, CMP, fasting lipids and health maintenance care gaps -Counseled on lifestyle modifications for blood pressure control including reduced dietary sodium, increased exercise, adequate sleep  Grayce Sessions

## 2020-06-12 NOTE — Progress Notes (Signed)
Here for HTN f /u  

## 2020-06-19 ENCOUNTER — Other Ambulatory Visit (INDEPENDENT_AMBULATORY_CARE_PROVIDER_SITE_OTHER): Payer: Self-pay | Admitting: Primary Care

## 2020-07-23 ENCOUNTER — Ambulatory Visit (INDEPENDENT_AMBULATORY_CARE_PROVIDER_SITE_OTHER): Payer: 59 | Admitting: Primary Care

## 2020-07-23 ENCOUNTER — Other Ambulatory Visit: Payer: Self-pay

## 2020-07-23 VITALS — BP 166/77 | HR 94 | Temp 97.5°F | Resp 16 | Wt 182.0 lb

## 2020-07-23 DIAGNOSIS — I10 Essential (primary) hypertension: Secondary | ICD-10-CM

## 2020-07-23 DIAGNOSIS — Z79899 Other long term (current) drug therapy: Secondary | ICD-10-CM

## 2020-07-23 MED ORDER — LOSARTAN POTASSIUM 100 MG PO TABS: 100.0000 mg | ORAL_TABLET | Freq: Every day | ORAL | 3 refills | Status: DC

## 2020-07-23 MED ORDER — HYDROCHLOROTHIAZIDE 25 MG PO TABS: 25.0000 mg | ORAL_TABLET | Freq: Every day | ORAL | 3 refills | Status: DC

## 2020-07-23 MED FILL — LOSARTAN POTASSIUM 100 MG T: 100 | 90 days supply | Qty: 90 | Fill #0

## 2020-07-23 MED FILL — HYDROCHLOROTHIAZIDE 25 MG T: 25 | 90 days supply | Qty: 90 | Fill #0

## 2020-07-23 NOTE — Progress Notes (Signed)
Follow BP  Not given HCTZ and losartan at pharmacy.  Taking Hydralazine and amlodipine.   Patient did not pick up the rx. Did not know it was sent to [facility].

## 2020-07-24 ENCOUNTER — Telehealth (INDEPENDENT_AMBULATORY_CARE_PROVIDER_SITE_OTHER): Payer: Self-pay | Admitting: Primary Care

## 2020-07-24 MED ORDER — HYDROCHLOROTHIAZIDE 25 MG PO TABS: 25.0000 mg | ORAL_TABLET | Freq: Every day | ORAL | 3 refills | Status: DC

## 2020-07-24 MED ORDER — LOSARTAN POTASSIUM 100 MG PO TABS: 100.0000 mg | ORAL_TABLET | Freq: Every day | ORAL | 3 refills | Status: DC

## 2020-07-24 NOTE — Telephone Encounter (Signed)
Reordered losartan 100 mg and hydrodiuril 25 mg sent to new pharmacy [facility]-on file as patient requested. Ordered # and refills as previous by provider.

## 2020-07-24 NOTE — Telephone Encounter (Addendum)
Pt is calling and does not want to call [facility] to have her medication transfer to [facility] on [address]. Pt needs hctz 25 mg and losartan 100 mg. The med was sent to [facility]. Pt had an appt with michelle yesterday

## 2020-07-25 ENCOUNTER — Other Ambulatory Visit (INDEPENDENT_AMBULATORY_CARE_PROVIDER_SITE_OTHER): Payer: Self-pay | Admitting: Primary Care

## 2020-07-25 MED ORDER — HYDROCHLOROTHIAZIDE 25 MG PO TABS
25.0000 mg | ORAL_TABLET | Freq: Every day | ORAL | 3 refills | Status: DC
Start: 2020-07-25 — End: 2020-11-27

## 2020-07-25 MED ORDER — LOSARTAN POTASSIUM 100 MG PO TABS
100.0000 mg | ORAL_TABLET | Freq: Every day | ORAL | 3 refills | Status: DC
Start: 2020-07-25 — End: 2020-11-27

## 2020-07-25 MED ORDER — LOSARTAN POTASSIUM 100 MG PO TABS
100.0000 mg | ORAL_TABLET | Freq: Every day | ORAL | 3 refills | Status: DC
Start: 2020-07-25 — End: 2020-07-25

## 2020-07-27 NOTE — Progress Notes (Signed)
Established Patient Office Visit  Subjective:  Patient ID: Rebecca Huber, female    DOB: 04-Oct-1961  Age: 59 y.o. MRN: [id number]  CC:  Chief Complaint  Patient presents with  . Hypertension    HPI Rebecca Huber is a 59 year old female who presents for hypertension management-Denies shortness of breath, headaches, chest pain or lower extremity edema.  She has brought her medications in taking medications that have been discontinued and not taking medications that she was recently prescribed.  Removed hydralazine and dispose of them at [facility].  Wrote in pink letters to place on her refrigerator these are the blood pressure medications to take daily amlodipine 10 mg losartan 100 mg and HCTZ 25 mg  No past medical history on file.  No past surgical history on file.  No family history on file.  Social History   Socioeconomic History  . Marital status: Married    Spouse name: Not on file  . Number of children: Not on file  . Years of education: Not on file  . Highest education level: Not on file  Occupational History  . Not on file  Tobacco Use  . Smoking status: Never Smoker  . Smokeless tobacco: Never Used  Substance and Sexual Activity  . Alcohol use: Yes  . Drug use: Never  . Sexual activity: Not Currently  Other Topics Concern  . Not on file  Social History Narrative  . Not on file   Social Determinants of Health   Financial Resource Strain:   . Difficulty of Paying Living Expenses: Not on file  Food Insecurity:   . Worried About Programme researcher, broadcasting/film/video in the Last Year: Not on file  . Ran Out of Food in the Last Year: Not on file  Transportation Needs:   . Lack of Transportation (Medical): Not on file  . Lack of Transportation (Non-Medical): Not on file  Physical Activity:   . Days of Exercise per Week: Not on file  . Minutes of Exercise per Session: Not on file  Stress:   . Feeling of Stress : Not on file  Social Connections:   . Frequency of Communication  with Friends and Family: Not on file  . Frequency of Social Gatherings with Friends and Family: Not on file  . Attends Religious Services: Not on file  . Active Member of Clubs or Organizations: Not on file  . Attends Banker Meetings: Not on file  . Marital Status: Not on file  Intimate Partner Violence:   . Fear of Current or Ex-Partner: Not on file  . Emotionally Abused: Not on file  . Physically Abused: Not on file  . Sexually Abused: Not on file    Outpatient Medications Prior to Visit  Medication Sig Dispense Refill  . amLODipine (NORVASC) 10 MG tablet TAKE 1 TABLET(10 MG) BY MOUTH DAILY 90 tablet 1  . hydrochlorothiazide (HYDRODIURIL) 25 MG tablet Take 1 tablet (25 mg total) by mouth daily. (Patient not taking: Reported on 07/23/2020) 90 tablet 3  . losartan (COZAAR) 100 MG tablet Take 1 tablet (100 mg total) by mouth daily. (Patient not taking: Reported on 07/23/2020) 90 tablet 3   No facility-administered medications prior to visit.    No Known Allergies  ROS Review of Systems  All other systems reviewed and are negative.     Objective:    Physical Exam Vitals reviewed.  Constitutional:      Appearance: Normal appearance.  HENT:     Head:  Normocephalic.     Right Ear: Tympanic membrane normal.     Left Ear: Tympanic membrane normal.     Nose: Nose normal.  Cardiovascular:     Rate and Rhythm: Normal rate and regular rhythm.  Pulmonary:     Effort: Pulmonary effort is normal.     Breath sounds: Normal breath sounds.  Abdominal:     General: Bowel sounds are normal.  Musculoskeletal:        General: Normal range of motion.     Cervical back: Normal range of motion.  Skin:    General: Skin is warm and dry.  Neurological:     Mental Status: She is alert and oriented to person, place, and time.  Psychiatric:        Mood and Affect: Mood normal.        Behavior: Behavior normal.     BP (!) 166/77 (BP Location: Left Arm, Cuff Size: Large)    Pulse 94   Temp (!) 97.5 F (36.4 C)   Resp 16   Wt 182 lb (82.6 kg)   SpO2 99%   BMI 28.51 kg/m  Wt Readings from Last 3 Encounters:  07/23/20 182 lb (82.6 kg)  06/12/20 180 lb (81.6 kg)  03/12/20 190 lb 6.4 oz (86.4 kg)     Health Maintenance Due  Topic Date Due  . Hepatitis C Screening  Never done  . INFLUENZA VACCINE  Never done  . COVID-19 Vaccine (2 - Moderna 2-dose series) 06/04/2020    There are no preventive care reminders to display for this patient.  Lab Results  Component Value Date   TSH 2.589 09/06/2019   Lab Results  Component Value Date   WBC 6.6 09/09/2019   HGB 10.9 (L) 09/09/2019   HCT 33.7 (L) 09/09/2019   MCV 79.1 (L) 09/09/2019   PLT 214 09/09/2019   Lab Results  Component Value Date   NA 142 09/09/2019   K 3.7 09/09/2019   CO2 21 (L) 09/09/2019   GLUCOSE 96 09/09/2019   BUN 9 09/09/2019   CREATININE 0.93 09/09/2019   BILITOT 0.6 09/09/2019   ALKPHOS 38 09/09/2019   AST 16 09/09/2019   ALT 11 09/09/2019   PROT 5.9 (L) 09/09/2019   ALBUMIN 2.8 (L) 09/09/2019   CALCIUM 7.9 (L) 09/09/2019   ANIONGAP 6 09/09/2019   No results found for: CHOL No results found for: HDL No results found for: LDLCALC No results found for: TRIG No results found for: [facility] Lab Results  Component Value Date   HGBA1C 6.0 (H) 09/06/2019      Assessment & Plan:  Tinya was seen today for hypertension.  Diagnoses and all orders for this visit:  Essential hypertension Blood pressure systolic remains elevated however patient never picked up new blood pressure regimen goal of therapy will be 1 30-1 40 systolic and diastolic 80-90.  She is compliant with medication when she understands what and when she is to take medications.  She watches her sodium intake and walks.  Medication management Took all her medications about her case went over each one of them including vitamins discussed what she was supposed to be on and medication she will be taking daily  preferably in the morning because one of them was a diuretic that could cause increased urination.  To father help with compliance will out in bold letters medications to take and placed this on the front of her refrigerator  Other orders  hydrochlorothiazide (HYDRODIURIL) 25 MG tablet;  Take 1 tablet (25 mg total) by mouth daily. - : losartan (COZAAR) 100 MG tablet; Take 1 tablet (100 mg total) by mouth daily.    Meds ordered this encounter  Medications  . DISCONTD: hydrochlorothiazide (HYDRODIURIL) 25 MG tablet    Sig: Take 1 tablet (25 mg total) by mouth daily.    Dispense:  90 tablet    Refill:  3  . DISCONTD: losartan (COZAAR) 100 MG tablet    Sig: Take 1 tablet (100 mg total) by mouth daily.    Dispense:  90 tablet    Refill:  3    Follow-up: Return in about 6 weeks (around 09/03/2020).    Grayce Sessions, NP

## 2020-07-28 ENCOUNTER — Telehealth: Payer: Self-pay | Admitting: Primary Care

## 2020-07-28 NOTE — Telephone Encounter (Signed)
Copied from CRM [id number]. Topic: General - Other >> Jul 28, 2020 10:52 AM Dalphine Handing A wrote: Pt is requesting callback from nurse I nregards to her medication refills. Patient contacted [facility] about her 2 prescriptions and was advised that the refills were too soon and they could not release medication. Patient stated that she is completely out of both medications and would like [place] to contact pharmacy so that she can get her medication. Please advise

## 2020-07-29 NOTE — Telephone Encounter (Signed)
PT calling back to check on the status / please advise

## 2020-07-29 NOTE — Telephone Encounter (Signed)
Patient aware that medications had been filled at [facility] which is why she could not get them at [facility]. [facility] contacted [facility] pharmacy to return prescriptions so that patient could pick them up at [facility].

## 2020-08-01 ENCOUNTER — Other Ambulatory Visit: Payer: 59

## 2020-08-13 ENCOUNTER — Ambulatory Visit (INDEPENDENT_AMBULATORY_CARE_PROVIDER_SITE_OTHER): Payer: 59 | Admitting: Primary Care

## 2020-08-20 ENCOUNTER — Ambulatory Visit (INDEPENDENT_AMBULATORY_CARE_PROVIDER_SITE_OTHER): Payer: Self-pay | Admitting: Primary Care

## 2020-08-27 ENCOUNTER — Encounter (INDEPENDENT_AMBULATORY_CARE_PROVIDER_SITE_OTHER): Payer: Self-pay | Admitting: Primary Care

## 2020-08-27 ENCOUNTER — Other Ambulatory Visit: Payer: Self-pay

## 2020-08-27 ENCOUNTER — Ambulatory Visit (INDEPENDENT_AMBULATORY_CARE_PROVIDER_SITE_OTHER): Payer: 59 | Admitting: Primary Care

## 2020-08-27 VITALS — BP 139/72 | HR 99 | Temp 97.5°F | Ht 67.0 in | Wt 179.2 lb

## 2020-08-27 DIAGNOSIS — R195 Other fecal abnormalities: Secondary | ICD-10-CM

## 2020-08-27 DIAGNOSIS — Z760 Encounter for issue of repeat prescription: Secondary | ICD-10-CM | POA: Diagnosis not present

## 2020-08-27 DIAGNOSIS — I10 Essential (primary) hypertension: Secondary | ICD-10-CM | POA: Diagnosis not present

## 2020-08-27 MED ORDER — AMLODIPINE BESYLATE 10 MG PO TABS: ORAL_TABLET | ORAL | 1 refills | Status: DC

## 2020-08-27 NOTE — Progress Notes (Addendum)
Established Patient Office Visit  Subjective:  Patient ID: Rebecca Huber, female    DOB: May 28, 1961  Age: 59 y.o. MRN: [id number]  CC:  Chief Complaint  Patient presents with  . Blood Pressure Check    HPI [name] is a 59 year old female who presents for  blood pressure follow up. Denies shortness of breath, headaches, chest pain or lower extremity edema  No past medical history on file.  No past surgical history on file.  No family history on file.  Social History   Socioeconomic History  . Marital status: Married    Spouse name: Not on file  . Number of children: Not on file  . Years of education: Not on file  . Highest education level: Not on file  Occupational History  . Not on file  Tobacco Use  . Smoking status: Never Smoker  . Smokeless tobacco: Never Used  Substance and Sexual Activity  . Alcohol use: Yes  . Drug use: Never  . Sexual activity: Not Currently  Other Topics Concern  . Not on file  Social History Narrative  . Not on file   Social Determinants of Health   Financial Resource Strain:   . Difficulty of Paying Living Expenses: Not on file  Food Insecurity:   . Worried About Programme researcher, broadcasting/film/video in the Last Year: Not on file  . Ran Out of Food in the Last Year: Not on file  Transportation Needs:   . Lack of Transportation (Medical): Not on file  . Lack of Transportation (Non-Medical): Not on file  Physical Activity:   . Days of Exercise per Week: Not on file  . Minutes of Exercise per Session: Not on file  Stress:   . Feeling of Stress : Not on file  Social Connections:   . Frequency of Communication with Friends and Family: Not on file  . Frequency of Social Gatherings with Friends and Family: Not on file  . Attends Religious Services: Not on file  . Active Member of Clubs or Organizations: Not on file  . Attends Banker Meetings: Not on file  . Marital Status: Not on file  Intimate Partner Violence:   . Fear of  Current or Ex-Partner: Not on file  . Emotionally Abused: Not on file  . Physically Abused: Not on file  . Sexually Abused: Not on file    Outpatient Medications Prior to Visit  Medication Sig Dispense Refill  . hydrochlorothiazide (HYDRODIURIL) 25 MG tablet Take 1 tablet (25 mg total) by mouth daily. 90 tablet 3  . losartan (COZAAR) 100 MG tablet Take 1 tablet (100 mg total) by mouth daily. 90 tablet 3  . amLODipine (NORVASC) 10 MG tablet TAKE 1 TABLET(10 MG) BY MOUTH DAILY 90 tablet 1   No facility-administered medications prior to visit.    No Known Allergies  ROS Review of Systems  All other systems reviewed and are negative.     Objective:    Physical Exam Vitals reviewed.  HENT:     Head: Normocephalic.     Nose: Nose normal.  Eyes:     Extraocular Movements: Extraocular movements intact.  Cardiovascular:     Rate and Rhythm: Normal rate and regular rhythm.  Pulmonary:     Effort: Pulmonary effort is normal.     Breath sounds: Normal breath sounds.  Abdominal:     General: Bowel sounds are normal.     Palpations: Abdomen is soft.  Musculoskeletal:  General: Normal range of motion.     Cervical back: Normal range of motion.  Skin:    General: Skin is warm and dry.  Neurological:     Mental Status: She is alert and oriented to person, place, and time.  Psychiatric:        Mood and Affect: Mood normal.        Behavior: Behavior normal.        Thought Content: Thought content normal.        Judgment: Judgment normal.     BP 139/72 (BP Location: Left Arm, Patient Position: Sitting, Cuff Size: Normal)   Pulse 99   Temp (!) 97.5 F (36.4 C) (Temporal)   Ht 5\' 7"  (1.702 m)   Wt 179 lb 3.2 oz (81.3 kg)   SpO2 96%   BMI 28.07 kg/m  Wt Readings from Last 3 Encounters:  08/27/20 179 lb 3.2 oz (81.3 kg)  07/23/20 182 lb (82.6 kg)  06/12/20 180 lb (81.6 kg)     Health Maintenance Due  Topic Date Due  . Hepatitis C Screening  Never done     There are no preventive care reminders to display for this patient.  Lab Results  Component Value Date   TSH 2.589 09/06/2019   Lab Results  Component Value Date   WBC 6.6 09/09/2019   HGB 10.9 (L) 09/09/2019   HCT 33.7 (L) 09/09/2019   MCV 79.1 (L) 09/09/2019   PLT 214 09/09/2019   Lab Results  Component Value Date   NA 142 09/09/2019   K 3.7 09/09/2019   CO2 21 (L) 09/09/2019   GLUCOSE 96 09/09/2019   BUN 9 09/09/2019   CREATININE 0.93 09/09/2019   BILITOT 0.6 09/09/2019   ALKPHOS 38 09/09/2019   AST 16 09/09/2019   ALT 11 09/09/2019   PROT 5.9 (L) 09/09/2019   ALBUMIN 2.8 (L) 09/09/2019   CALCIUM 7.9 (L) 09/09/2019   ANIONGAP 6 09/09/2019   No results found for: CHOL No results found for: HDL No results found for: LDLCALC No results found for: TRIG No results found for: [facility] Lab Results  Component Value Date   HGBA1C 6.0 (H) 09/06/2019      Assessment & Plan:  Mikaia was seen today for blood pressure check.  Diagnoses and all orders for this visit:  Occult blood in stools -     Ambulatory referral to Gastroenterology  Essential hypertension Counseled on blood pressure goal of less than 130/80, low-sodium, DASH diet, medication compliance, 150 minutes of moderate intensity exercise per week.  Medication refill -     amLODipine (NORVASC) 10 MG tablet; TAKE 1 TABLET(10 MG) BY MOUTH DAILY    Follow-up: Return in about 3 months (around 11/27/2020) for BP 11/29/2020 .    Vickie Epley, NP

## 2020-08-27 NOTE — Patient Instructions (Signed)

## 2020-08-28 ENCOUNTER — Other Ambulatory Visit (INDEPENDENT_AMBULATORY_CARE_PROVIDER_SITE_OTHER): Payer: Self-pay | Admitting: Primary Care

## 2020-09-08 ENCOUNTER — Ambulatory Visit (INDEPENDENT_AMBULATORY_CARE_PROVIDER_SITE_OTHER): Payer: 59 | Admitting: Primary Care

## 2020-09-08 ENCOUNTER — Other Ambulatory Visit: Payer: Self-pay

## 2020-09-08 ENCOUNTER — Encounter (INDEPENDENT_AMBULATORY_CARE_PROVIDER_SITE_OTHER): Payer: Self-pay | Admitting: Primary Care

## 2020-09-08 VITALS — BP 137/80 | HR 95 | Temp 97.5°F | Ht 67.0 in | Wt 177.0 lb

## 2020-09-08 DIAGNOSIS — R55 Syncope and collapse: Secondary | ICD-10-CM

## 2020-09-08 NOTE — Progress Notes (Signed)
Acute Office Visit  Subjective:    Patient ID: Rebecca Huber, female    DOB: 1961/04/17, 59 y.o.   MRN: [id number]  Chief Complaint  Patient presents with  . Loss of Consciousness    HPI RebeccaRebecca Huber. Nine is a 59 year old female who presents for a syncope episode with on Thanksgiving day.  Rebecca Huber was trying to cook macaroni and cheese the next thing she knew her son was helping her to get up off the floor told her in the back of her eyes rolled in the back of her head and her tongue was hanging out and throwing up.Marland Kitchen  EMS came and evaluated her she presents with blood pressure reading on that day 100/63 and blood sugar 135.  She has not had another episode since that day.    No past medical history on file.  No past surgical history on file.  No family history on file.  Social History   Socioeconomic History  . Marital status: Married    Spouse name: Not on file  . Number of children: Not on file  . Years of education: Not on file  . Highest education level: Not on file  Occupational History  . Not on file  Tobacco Use  . Smoking status: Never Smoker  . Smokeless tobacco: Never Used  Substance and Sexual Activity  . Alcohol use: Yes  . Drug use: Never  . Sexual activity: Not Currently  Other Topics Concern  . Not on file  Social History Narrative  . Not on file   Social Determinants of Health   Financial Resource Strain:   . Difficulty of Paying Living Expenses: Not on file  Food Insecurity:   . Worried About Programme researcher, broadcasting/film/video in the Last Year: Not on file  . Ran Out of Food in the Last Year: Not on file  Transportation Needs:   . Lack of Transportation (Medical): Not on file  . Lack of Transportation (Non-Medical): Not on file  Physical Activity:   . Days of Exercise per Week: Not on file  . Minutes of Exercise per Session: Not on file  Stress:   . Feeling of Stress : Not on file  Social Connections:   . Frequency of Communication with Friends and  Family: Not on file  . Frequency of Social Gatherings with Friends and Family: Not on file  . Attends Religious Services: Not on file  . Active Member of Clubs or Organizations: Not on file  . Attends Banker Meetings: Not on file  . Marital Status: Not on file  Intimate Partner Violence:   . Fear of Current or Ex-Partner: Not on file  . Emotionally Abused: Not on file  . Physically Abused: Not on file  . Sexually Abused: Not on file    Outpatient Medications Prior to Visit  Medication Sig Dispense Refill  . amLODipine (NORVASC) 10 MG tablet TAKE 1 TABLET(10 MG) BY MOUTH DAILY 90 tablet 1  . hydrochlorothiazide (HYDRODIURIL) 25 MG tablet Take 1 tablet (25 mg total) by mouth daily. 90 tablet 3  . losartan (COZAAR) 100 MG tablet Take 1 tablet (100 mg total) by mouth daily. 90 tablet 3   No facility-administered medications prior to visit.    No Known Allergies  Review of Systems Comprehensive review of systems positive are reflected in HPI    Objective:    Physical Exam Vitals reviewed.  Constitutional:      Appearance: Normal appearance.  She is normal weight.  HENT:     Head: Normocephalic.     Nose: Nose normal.  Eyes:     Extraocular Movements: Extraocular movements intact.  Cardiovascular:     Rate and Rhythm: Normal rate and regular rhythm.  Pulmonary:     Effort: Pulmonary effort is normal.     Breath sounds: Normal breath sounds.  Abdominal:     General: Bowel sounds are normal.     Palpations: Abdomen is soft.  Musculoskeletal:        General: Normal range of motion.     Cervical back: Normal range of motion and neck supple.  Skin:    General: Skin is warm and dry.  Neurological:     Mental Status: She is alert and oriented to person, place, and time.  Psychiatric:        Mood and Affect: Mood normal.        Behavior: Behavior normal.        Thought Content: Thought content normal.        Judgment: Judgment normal.     BP 137/80 (BP  Location: Left Arm, Patient Position: Sitting, Cuff Size: Normal)   Pulse 95   Temp (!) 97.5 F (36.4 C) (Temporal)   Ht 5\' 7"  (1.702 m)   Wt 177 lb (80.3 kg)   SpO2 99%   BMI 27.72 kg/m  Wt Readings from Last 3 Encounters:  09/08/20 177 lb (80.3 kg)  08/27/20 179 lb 3.2 oz (81.3 kg)  07/23/20 182 lb (82.6 kg)    Health Maintenance Due  Topic Date Due  . Hepatitis C Screening  Never done    There are no preventive care reminders to display for this patient.   Lab Results  Component Value Date   TSH 2.589 09/06/2019   Lab Results  Component Value Date   WBC 6.6 09/09/2019   HGB 10.9 (L) 09/09/2019   HCT 33.7 (L) 09/09/2019   MCV 79.1 (L) 09/09/2019   PLT 214 09/09/2019   Lab Results  Component Value Date   NA 142 09/09/2019   K 3.7 09/09/2019   CO2 21 (L) 09/09/2019   GLUCOSE 96 09/09/2019   BUN 9 09/09/2019   CREATININE 0.93 09/09/2019   BILITOT 0.6 09/09/2019   ALKPHOS 38 09/09/2019   AST 16 09/09/2019   ALT 11 09/09/2019   PROT 5.9 (L) 09/09/2019   ALBUMIN 2.8 (L) 09/09/2019   CALCIUM 7.9 (L) 09/09/2019   ANIONGAP 6 09/09/2019   No results found for: CHOL No results found for: HDL No results found for: LDLCALC No results found for: TRIG No results found for: CHOLHDL Lab Results  Component Value Date   HGBA1C 6.0 (H) 09/06/2019       Assessment & Plan:  Rebecca Huber was seen today for loss of consciousness.  Diagnoses and all orders for this visit:  Syncope, unspecified syncope type After questioning patient's about different variables 1 that was relevant she was cooking got overheated then may be a syncope happened  No orders of the defined types were placed in this encounter.    Stanton Kidney, NP

## 2020-09-08 NOTE — Patient Instructions (Signed)
     Syncope Syncope is when you pass out (faint) for a short time. It is caused by a sudden decrease in blood flow to the brain. Signs that you may be about to pass out include:  Feeling dizzy or light-headed.  Feeling sick to your stomach (nauseous).  Seeing all white or all black.  Having cold, clammy skin. If you pass out, get help right away. Call your local emergency services (911 in the [place] Do not drive yourself to the hospital. Follow these instructions at home: Watch for any changes in your symptoms. Take these actions to stay safe and help with your symptoms: Lifestyle  Do not drive, use machinery, or play sports until your doctor says it is okay.  Do not drink alcohol.  Do not use any products that contain nicotine or tobacco, such as cigarettes and e-cigarettes. If you need help quitting, ask your doctor.  Drink enough fluid to keep your pee (urine) pale yellow. General instructions  Take over-the-counter and prescription medicines only as told by your doctor.  If you are taking blood pressure or heart medicine, sit up and stand up slowly. Spend a few minutes getting ready to sit and then stand. This can help you feel less dizzy.  Have someone stay with you until you feel stable.  If you start to feel like you might pass out, lie down right away and raise (elevate) your feet above the level of your heart. Breathe deeply and steadily. Wait until all of the symptoms are gone.  Keep all follow-up visits as told by your doctor. This is important. Get help right away if:  You have a very bad headache.  You pass out once or more than once.  You have pain in your chest, belly, or back.  You have a very fast or uneven heartbeat (palpitations).  It hurts to breathe.  You are bleeding from your mouth or your bottom (rectum).  You have black or tarry poop (stool).  You have jerky movements that you cannot control (seizure).  You are confused.  You have  trouble walking.  You are very weak.  You have vision problems. These symptoms may be an emergency. Do not wait to see if the symptoms will go away. Get medical help right away. Call your local emergency services (911 in the [place] Do not drive yourself to the hospital. Summary  Syncope is when you pass out (faint) for a short time. It is caused by a sudden decrease in blood flow to the brain.  Signs that you may be about to faint include feeling dizzy, light-headed, or sick to your stomach, seeing all white or all black, or having cold, clammy skin.  If you start to feel like you might pass out, lie down right away and raise (elevate) your feet above the level of your heart. Breathe deeply and steadily. Wait until all of the symptoms are gone. This information is not intended to replace advice given to you by your health care provider. Make sure you discuss any questions you have with your health care provider. Document Revised: 11/09/2017 Document Reviewed: 11/09/2017 Elsevier Patient Education  2020 [employer]  

## 2020-09-09 ENCOUNTER — Ambulatory Visit (INDEPENDENT_AMBULATORY_CARE_PROVIDER_SITE_OTHER): Payer: 59 | Admitting: Primary Care

## 2020-09-25 ENCOUNTER — Encounter: Payer: Self-pay | Admitting: Nurse Practitioner

## 2020-10-17 ENCOUNTER — Ambulatory Visit: Payer: 59 | Admitting: Nurse Practitioner

## 2020-11-27 ENCOUNTER — Encounter (INDEPENDENT_AMBULATORY_CARE_PROVIDER_SITE_OTHER): Payer: Self-pay | Admitting: Primary Care

## 2020-11-27 ENCOUNTER — Other Ambulatory Visit: Payer: Self-pay

## 2020-11-27 ENCOUNTER — Ambulatory Visit (INDEPENDENT_AMBULATORY_CARE_PROVIDER_SITE_OTHER): Payer: 59 | Admitting: Primary Care

## 2020-11-27 VITALS — BP 129/78 | HR 94 | Temp 97.5°F | Ht 67.0 in | Wt 182.2 lb

## 2020-11-27 DIAGNOSIS — Z13220 Encounter for screening for lipoid disorders: Secondary | ICD-10-CM | POA: Diagnosis not present

## 2020-11-27 DIAGNOSIS — Z760 Encounter for issue of repeat prescription: Secondary | ICD-10-CM | POA: Diagnosis not present

## 2020-11-27 DIAGNOSIS — I10 Essential (primary) hypertension: Secondary | ICD-10-CM

## 2020-11-27 DIAGNOSIS — R7303 Prediabetes: Secondary | ICD-10-CM | POA: Diagnosis not present

## 2020-11-27 LAB — POCT GLYCOSYLATED HEMOGLOBIN (HGB A1C): Hemoglobin A1C: 5.2 % (ref 4.0–5.6)

## 2020-11-27 MED ORDER — AMLODIPINE BESYLATE 10 MG PO TABS: ORAL_TABLET | ORAL | 1 refills | Status: DC

## 2020-11-27 MED ORDER — HYDROCHLOROTHIAZIDE 25 MG PO TABS: 25.0000 mg | ORAL_TABLET | Freq: Every day | ORAL | 3 refills | Status: DC

## 2020-11-27 MED ORDER — LOSARTAN POTASSIUM 100 MG PO TABS: 100.0000 mg | ORAL_TABLET | Freq: Every day | ORAL | 3 refills | Status: DC

## 2020-11-27 NOTE — Patient Instructions (Signed)

## 2020-11-27 NOTE — Progress Notes (Signed)
[facility]    [name] presents for  hypertension evaluation, on previous visit medication was adjusted to include amlodipine 6m daily.  Denies shortness of breath, headaches, chest pain or lower extremity edema. Requesting medication refills.  Patient reports adherence with medications.  Current Medication List Current Outpatient Medications on File Prior to Visit  Medication Sig Dispense Refill  . amLODipine (NORVASC) 10 MG tablet TAKE 1 TABLET(10 MG) BY MOUTH DAILY 90 tablet 1  . hydrochlorothiazide (HYDRODIURIL) 25 MG tablet Take 1 tablet (25 mg total) by mouth daily. 90 tablet 3  . losartan (COZAAR) 100 MG tablet Take 1 tablet (100 mg total) by mouth daily. 90 tablet 3   No current facility-administered medications on file prior to visit.   Past Medical History  No past medical history on file. Dietary habits include: healthy diet monitor sodium intake  Exercise habits include:walk  Family / Social history: No  ASCVD risk factors include- CMali O:  Vitals:   11/27/20 1102  BP: 129/78  Pulse: 94  Temp: (!) 97.5 F (36.4 C)  TempSrc: Temporal  SpO2: 99%  Weight: 182 lb 3.2 oz (82.6 kg)  Height: 5' 7"  (1.702 m)  Body mass index is 28.54 kg/m. General: Vital signs reviewed.  Patient is well-developed and well-nourished,female  in no acute distress and cooperative with exam.  Head: Normocephalic and atraumatic. Eyes: EOMI, conjunctivae normal, no scleral icterus.  Neck: Supple, trachea midline, normal ROM, no JVD, masses, thyromegaly, or carotid bruit present.  Cardiovascular: RRR, S1 normal, S2 normal, no murmurs, gallops, or rubs. Pulmonary/Chest: Clear to auscultation bilaterally, no wheezes, rales, or rhonchi. Abdominal: Soft, non-tender, non-distended, BS +, no masses, organomegaly, or guarding present.  Musculoskeletal: No joint deformities, erythema, or stiffness, ROM full and nontender. Extremities: No lower extremity edema  bilaterally,  Neurological: A&O x3, Strength is normal and symmetric bilaterally Skin: Warm, dry and intact. No rashes or erythema. Psychiatric: Normal mood and affect. speech and behavior is normal. Cognition and memory are normal.   ROS  pertinent positives and negatives as noted in HPI  Last 3 Office BP readings: BP Readings from Last 3 Encounters:  11/27/20 129/78  09/08/20 137/80  08/27/20 139/72    BMET    Component Value Date/Time   NA 142 09/09/2019 0520   K 3.7 09/09/2019 0520   CL 115 (H) 09/09/2019 0520   CO2 21 (L) 09/09/2019 0520   GLUCOSE 96 09/09/2019 0520   BUN 9 09/09/2019 0520   CREATININE 0.93 09/09/2019 0520   CALCIUM 7.9 (L) 09/09/2019 0520   CALCIUM 8.1 (L) 09/06/2019 0041   GFRNONAA >60 09/09/2019 0520   GFRAA >60 09/09/2019 0520    Renal function: CrCl cannot be calculated (Patient's most recent lab result is older than the maximum 21 days allowed.).  Clinical ASCVD: No  The ASCVD Risk score (Mikey BussingDC Jr., et al., 2013) failed to calculate for the following reasons:   Cannot find a previous HDL lab   Cannot find a previous total cholesterol lab   A/P: .DMeilywas seen today for blood pressure check.  Diagnoses and all orders for this visit:  Essential hypertension  longstanding diagnosed currently on the meds listed below  -     hydrochlorothiazide (HYDRODIURIL) 25 MG tablet; Take 1 tablet (25 mg total) by mouth daily. -     losartan (COZAAR) 100 MG tablet; Take 1 tablet (100 mg total) by mouth daily. -     amLODipine (NORVASC) 10 MG  tablet; TAKE 1 TABLET(10 MG) BY MOUTH DAILY -      BP Goal = 130/80 mmHg. Is met . Patient is adherent with current medications.  -Continued  -F/u labs ordered  CMP14+EGFR -Counseled on lifestyle modifications for blood pressure control including reduced dietary sodium, increased exercise, adequate sleep  Prediabetes -     HgB A1c 5.2 per ADA guideline not a diabetic  -     CBC with Differential  Medication  refill -     hydrochlorothiazide (HYDRODIURIL) 25 MG tablet; Take 1 tablet (25 mg total) by mouth daily. -     losartan (COZAAR) 100 MG tablet; Take 1 tablet (100 mg total) by mouth daily. -     amLODipine (NORVASC) 10 MG tablet; TAKE 1 TABLET(10 MG) BY MOUTH DAILY  Lipid screening -     Lipid Panel  Kerin Perna

## 2020-11-28 LAB — CMP14+EGFR
ALT: 7 IU/L (ref 0–32)
AST: 14 IU/L (ref 0–40)
Albumin/Globulin Ratio: 1.6 (ref 1.2–2.2)
Albumin: 4.5 g/dL (ref 3.8–4.8)
Alkaline Phosphatase: 71 IU/L (ref 44–121)
BUN/Creatinine Ratio: 15 (ref 12–28)
BUN: 17 mg/dL (ref 8–27)
Bilirubin Total: 0.6 mg/dL (ref 0.0–1.2)
CO2: 23 mmol/L (ref 20–29)
Calcium: 9.5 mg/dL (ref 8.7–10.3)
Chloride: 104 mmol/L (ref 96–106)
Creatinine, Ser: 1.16 mg/dL — ABNORMAL HIGH (ref 0.57–1.00)
GFR calc Af Amer: 59 mL/min/{1.73_m2} — ABNORMAL LOW (ref >59)
GFR calc non Af Amer: 51 mL/min/{1.73_m2} — ABNORMAL LOW (ref >59)
Globulin, Total: 2.8 g/dL (ref 1.5–4.5)
Glucose: 95 mg/dL (ref 65–99)
Potassium: 3.9 mmol/L (ref 3.5–5.2)
Sodium: 141 mmol/L (ref 134–144)
Total Protein: 7.3 g/dL (ref 6.0–8.5)

## 2020-11-28 LAB — CBC WITH DIFFERENTIAL/PLATELET
Basophils Absolute: 0.1 10*3/uL (ref 0.0–0.2)
Basos: 1 %
EOS (ABSOLUTE): 0.1 10*3/uL (ref 0.0–0.4)
Eos: 1 %
Hematocrit: 33.7 % — ABNORMAL LOW (ref 34.0–46.6)
Hemoglobin: 11.1 g/dL (ref 11.1–15.9)
Immature Grans (Abs): 0.1 10*3/uL (ref 0.0–0.1)
Immature Granulocytes: 1 %
Lymphocytes Absolute: 1.9 10*3/uL (ref 0.7–3.1)
Lymphs: 24 %
MCH: 26.1 pg — ABNORMAL LOW (ref 26.6–33.0)
MCHC: 32.9 g/dL (ref 31.5–35.7)
MCV: 79 fL (ref 79–97)
Monocytes Absolute: 0.4 10*3/uL (ref 0.1–0.9)
Monocytes: 6 %
Neutrophils Absolute: 5.2 10*3/uL (ref 1.4–7.0)
Neutrophils: 67 %
Platelets: 285 10*3/uL (ref 150–450)
RBC: 4.26 x10E6/uL (ref 3.77–5.28)
RDW: 14.5 % (ref 11.7–15.4)
WBC: 7.7 10*3/uL (ref 3.4–10.8)

## 2020-11-28 LAB — LIPID PANEL
Chol/HDL Ratio: 2.9 ratio (ref 0.0–4.4)
Cholesterol, Total: 198 mg/dL (ref 100–199)
HDL: 68 mg/dL (ref >39)
LDL Chol Calc (NIH): 120 mg/dL — ABNORMAL HIGH (ref 0–99)
Triglycerides: 54 mg/dL (ref 0–149)
VLDL Cholesterol Cal: 10 mg/dL (ref 5–40)

## 2020-12-04 ENCOUNTER — Telehealth (INDEPENDENT_AMBULATORY_CARE_PROVIDER_SITE_OTHER): Payer: Self-pay

## 2020-12-04 NOTE — Telephone Encounter (Signed)
Patient is aware that labs are normal except for elevated creatinine which is an indication of kidney function. No treatment at this time. Will monitor. Advised to increase water intake and decrease carbs and sodas. She verbalized understanding. Maryjean Morn, CMA

## 2020-12-04 NOTE — Telephone Encounter (Signed)
-----   Message from Grayce Sessions, NP sent at 12/02/2020  1:04 PM EST ----- Labs are normal except elevated creatinine this is a indicator of how your kidney are function. Increase water decrease carbs, soda. No tx needed

## 2021-05-23 ENCOUNTER — Other Ambulatory Visit (INDEPENDENT_AMBULATORY_CARE_PROVIDER_SITE_OTHER): Payer: Self-pay | Admitting: Primary Care

## 2021-05-23 DIAGNOSIS — I10 Essential (primary) hypertension: Secondary | ICD-10-CM

## 2021-05-23 DIAGNOSIS — Z760 Encounter for issue of repeat prescription: Secondary | ICD-10-CM

## 2021-05-28 ENCOUNTER — Encounter (INDEPENDENT_AMBULATORY_CARE_PROVIDER_SITE_OTHER): Payer: Self-pay | Admitting: Nurse Practitioner

## 2021-05-28 ENCOUNTER — Ambulatory Visit (INDEPENDENT_AMBULATORY_CARE_PROVIDER_SITE_OTHER): Payer: 59 | Admitting: Nurse Practitioner

## 2021-05-28 ENCOUNTER — Other Ambulatory Visit: Payer: Self-pay

## 2021-05-28 VITALS — BP 132/74 | HR 92 | Temp 97.5°F | Ht 67.0 in | Wt 189.2 lb

## 2021-05-28 DIAGNOSIS — I10 Essential (primary) hypertension: Secondary | ICD-10-CM | POA: Diagnosis not present

## 2021-05-28 NOTE — Patient Instructions (Addendum)
Hypertension:  Continue current medications  Stay active  Heart healthy diet  Follow up:  Follow up in 6 months for CPE and labs  Hypertension, Adult Hypertension is another name for high blood pressure. High blood pressure forces your heart to work harder to pump blood. This can cause problems overtime. There are two numbers in a blood pressure reading. There is a top number (systolic) over a bottom number (diastolic). It is best to have a blood pressure that is below 120/80. Healthy choicescan help lower your blood pressure, or you may need medicine to help lower it. What are the causes? The cause of this condition is not known. Some conditions may be related tohigh blood pressure. What increases the risk? Smoking. Having type 2 diabetes mellitus, high cholesterol, or both. Not getting enough exercise or physical activity. Being overweight. Having too much fat, sugar, calories, or salt (sodium) in your diet. Drinking too much alcohol. Having long-term (chronic) kidney disease. Having a family history of high blood pressure. Age. Risk increases with age. Race. You may be at higher risk if you are African American. Gender. Men are at higher risk than women before age 96. After age 15, women are at higher risk than men. Having obstructive sleep apnea. Stress. What are the signs or symptoms? High blood pressure may not cause symptoms. Very high blood pressure (hypertensive crisis) may cause: Headache. Feelings of worry or nervousness (anxiety). Shortness of breath. Nosebleed. A feeling of being sick to your stomach (nausea). Throwing up (vomiting). Changes in how you see. Very bad chest pain. Seizures. How is this treated? This condition is treated by making healthy lifestyle changes, such as: Eating healthy foods. Exercising more. Drinking less alcohol. Your health care provider may prescribe medicine if lifestyle changes are not enough to get your blood pressure under  control, and if: Your top number is above 130. Your bottom number is above 80. Your personal target blood pressure may vary. Follow these instructions at home: Eating and drinking  If told, follow the DASH eating plan. To follow this plan: Fill one half of your plate at each meal with fruits and vegetables. Fill one fourth of your plate at each meal with whole grains. Whole grains include whole-wheat pasta, brown rice, and whole-grain bread. Eat or drink low-fat dairy products, such as skim milk or low-fat yogurt. Fill one fourth of your plate at each meal with low-fat (lean) proteins. Low-fat proteins include fish, chicken without skin, eggs, beans, and tofu. Avoid fatty meat, cured and processed meat, or chicken with skin. Avoid pre-made or processed food. Eat less than 1,500 mg of salt each day. Do not drink alcohol if: Your doctor tells you not to drink. You are pregnant, may be pregnant, or are planning to become pregnant. If you drink alcohol: Limit how much you use to: 0-1 drink a day for women. 0-2 drinks a day for men. Be aware of how much alcohol is in your drink. In the [place] one drink equals one 12 oz bottle of beer (355 mL), one 5 oz glass of wine (148 mL), or one 1 oz glass of hard liquor (44 mL).  Lifestyle  Work with your doctor to stay at a healthy weight or to lose weight. Ask your doctor what the best weight is for you. Get at least 30 minutes of exercise most days of the week. This may include walking, swimming, or biking. Get at least 30 minutes of exercise that strengthens your muscles (resistance exercise) at  least 3 days a week. This may include lifting weights or doing Pilates. Do not use any products that contain nicotine or tobacco, such as cigarettes, e-cigarettes, and chewing tobacco. If you need help quitting, ask your doctor. Check your blood pressure at home as told by your doctor. Keep all follow-up visits as told by your doctor. This is  important.  Medicines Take over-the-counter and prescription medicines only as told by your doctor. Follow directions carefully. Do not skip doses of blood pressure medicine. The medicine does not work as well if you skip doses. Skipping doses also puts you at risk for problems. Ask your doctor about side effects or reactions to medicines that you should watch for. Contact a doctor if you: Think you are having a reaction to the medicine you are taking. Have headaches that keep coming back (recurring). Feel dizzy. Have swelling in your ankles. Have trouble with your vision. Get help right away if you: Get a very bad headache. Start to feel mixed up (confused). Feel weak or numb. Feel faint. Have very bad pain in your: Chest. Belly (abdomen). Throw up more than once. Have trouble breathing. Summary Hypertension is another name for high blood pressure. High blood pressure forces your heart to work harder to pump blood. For most people, a normal blood pressure is less than 120/80. Making healthy choices can help lower blood pressure. If your blood pressure does not get lower with healthy choices, you may need to take medicine. This information is not intended to replace advice given to you by your health care provider. Make sure you discuss any questions you have with your healthcare provider. Document Revised: 06/07/2018 Document Reviewed: 06/07/2018 Elsevier Patient Education  2022 Else[employer] Inc.  Managing Your Hypertension Hypertension, also called high blood pressure, is when the force of the blood pressing against the walls of the arteries is too strong. Arteries are blood vessels that carry blood from your heart throughout your body. Hypertension forces the heart to work harder to pump blood and may cause the arteries tobecome narrow or stiff. Understanding blood pressure readings Your personal target blood pressure may vary depending on your medical conditions, your age, and  other factors. A blood pressure reading includes a higher number over a lower number. Ideally, your blood pressure should be below 120/80. You should know that: The first, or top, number is called the systolic pressure. It is a measure of the pressure in your arteries as your heart beats. The second, or bottom number, is called the diastolic pressure. It is a measure of the pressure in your arteries as the heart relaxes. Blood pressure is classified into four stages. Based on your blood pressure reading, your health care provider may use the following stages to determine what type of treatment you need, if any. Systolic pressure and diastolicpressure are measured in a unit called mmHg. Normal Systolic pressure: below 120. Diastolic pressure: below 80. Elevated Systolic pressure: 120-129. Diastolic pressure: below 80. Hypertension stage 1 Systolic pressure: 130-139. Diastolic pressure: 80-89. Hypertension stage 2 Systolic pressure: 140 or above. Diastolic pressure: 90 or above. How can this condition affect me? Managing your hypertension is an important responsibility. Over time, hypertension can damage the arteries and decrease blood flow to important parts of the body, including the brain, heart, and kidneys. Having untreated or uncontrolled hypertension can lead to: A heart attack. A stroke. A weakened blood vessel (aneurysm). Heart failure. Kidney damage. Eye damage. Metabolic syndrome. Memory and concentration problems. Vascular dementia. What actions  can I take to manage this condition? Hypertension can be managed by making lifestyle changes and possibly by taking medicines. Your health care provider will help you make a plan to bring yourblood pressure within a normal range. Nutrition  Eat a diet that is high in fiber and potassium, and low in salt (sodium), added sugar, and fat. An example eating plan is called the Dietary Approaches to Stop Hypertension (DASH) diet. To eat this  way: Eat plenty of fresh fruits and vegetables. Try to fill one-half of your plate at each meal with fruits and vegetables. Eat whole grains, such as whole-wheat pasta, brown rice, or whole-grain bread. Fill about one-fourth of your plate with whole grains. Eat low-fat dairy products. Avoid fatty cuts of meat, processed or cured meats, and poultry with skin. Fill about one-fourth of your plate with lean proteins such as fish, chicken without skin, beans, eggs, and tofu. Avoid pre-made and processed foods. These tend to be higher in sodium, added sugar, and fat. Reduce your daily sodium intake. Most people with hypertension should eat less than 1,500 mg of sodium a day.  Lifestyle  Work with your health care provider to maintain a healthy body weight or to lose weight. Ask what an ideal weight is for you. Get at least 30 minutes of exercise that causes your heart to beat faster (aerobic exercise) most days of the week. Activities may include walking, swimming, or biking. Include exercise to strengthen your muscles (resistance exercise), such as weight lifting, as part of your weekly exercise routine. Try to do these types of exercises for 30 minutes at least 3 days a week. Do not use any products that contain nicotine or tobacco, such as cigarettes, e-cigarettes, and chewing tobacco. If you need help quitting, ask your health care provider. Control any long-term (chronic) conditions you have, such as high cholesterol or diabetes. Identify your sources of stress and find ways to manage stress. This may include meditation, deep breathing, or making time for fun activities.  Alcohol use Do not drink alcohol if: Your health care provider tells you not to drink. You are pregnant, may be pregnant, or are planning to become pregnant. If you drink alcohol: Limit how much you use to: 0-1 drink a day for women. 0-2 drinks a day for men. Be aware of how much alcohol is in your drink. In the [place] one  drink equals one 12 oz bottle of beer (355 mL), one 5 oz glass of wine (148 mL), or one 1 oz glass of hard liquor (44 mL). Medicines Your health care provider may prescribe medicine if lifestyle changes are not enough to get your blood pressure under control and if: Your systolic blood pressure is 130 or higher. Your diastolic blood pressure is 80 or higher. Take medicines only as told by your health care provider. Follow the directions carefully. Blood pressure medicines must be taken as told by your health care provider. The medicine does not work as well when you skip doses. Skippingdoses also puts you at risk for problems. Monitoring Before you monitor your blood pressure: Do not smoke, drink caffeinated beverages, or exercise within 30 minutes before taking a measurement. Use the bathroom and empty your bladder (urinate). Sit quietly for at least 5 minutes before taking measurements. Monitor your blood pressure at home as told by your health care provider. To do this: Sit with your back straight and supported. Place your feet flat on the floor. Do not cross your legs. Support your  arm on a flat surface, such as a table. Make sure your upper arm is at heart level. Each time you measure, take two or three readings one minute apart and record the results. You may also need to have your blood pressure checked regularly by your healthcare provider. General information Talk with your health care provider about your diet, exercise habits, and other lifestyle factors that may be contributing to hypertension. Review all the medicines you take with your health care provider because there may be side effects or interactions. Keep all visits as told by your health care provider. Your health care provider can help you create and adjust your plan for managing your high blood pressure. Where to find more information National Heart, Lung, and Blood Institute: PopSteam.iswww.nhlbi.nih.gov American Heart  Association: www.heart.org Contact a health care provider if: You think you are having a reaction to medicines you have taken. You have repeated (recurrent) headaches. You feel dizzy. You have swelling in your ankles. You have trouble with your vision. Get help right away if: You develop a severe headache or confusion. You have unusual weakness or numbness, or you feel faint. You have severe pain in your chest or abdomen. You vomit repeatedly. You have trouble breathing. These symptoms may represent a serious problem that is an emergency. Do not wait to see if the symptoms will go away. Get medical help right away. Call your local emergency services (911 in the [place] Do not drive yourself to the hospital. Summary Hypertension is when the force of blood pumping through your arteries is too strong. If this condition is not controlled, it may put you at risk for serious complications. Your personal target blood pressure may vary depending on your medical conditions, your age, and other factors. For most people, a normal blood pressure is less than 120/80. Hypertension is managed by lifestyle changes, medicines, or both. Lifestyle changes to help manage hypertension include losing weight, eating a healthy, low-sodium diet, exercising more, stopping smoking, and limiting alcohol. This information is not intended to replace advice given to you by your health care provider. Make sure you discuss any questions you have with your healthcare provider. Document Revised: 11/02/2019 Document Reviewed: 08/28/2019 Elsevier Patient Education  2022 [employer]  https://www.mata.com/https://www.nhlbi.nih.gov/files/docs/public/heart/dash_brief.pdf">  DASH Eating Plan DASH stands for Dietary Approaches to Stop Hypertension. The DASH eating plan is a healthy eating plan that has been shown to: Reduce high blood pressure (hypertension). Reduce your risk for type 2 diabetes, heart disease, and stroke. Help with weight  loss. What are tips for following this plan? Reading food labels Check food labels for the amount of salt (sodium) per serving. Choose foods with less than 5 percent of the Daily Value of sodium. Generally, foods with less than 300 milligrams (mg) of sodium per serving fit into this eating plan. To find whole grains, look for the word "whole" as the first word in the ingredient list. Shopping Buy products labeled as "low-sodium" or "no salt added." Buy fresh foods. Avoid canned foods and pre-made or frozen meals. Cooking Avoid adding salt when cooking. Use salt-free seasonings or herbs instead of table salt or sea salt. Check with your health care provider or pharmacist before using salt substitutes. Do not fry foods. Cook foods using healthy methods such as baking, boiling, grilling, roasting, and broiling instead. Cook with heart-healthy oils, such as olive, canola, avocado, soybean, or sunflower oil. Meal planning  Eat a balanced diet that includes: 4 or more servings of fruits and 4  or more servings of vegetables each day. Try to fill one-half of your plate with fruits and vegetables. 6-8 servings of whole grains each day. Less than 6 oz (170 g) of lean meat, poultry, or fish each day. A 3-oz (85-g) serving of meat is about the same size as a deck of cards. One egg equals 1 oz (28 g). 2-3 servings of low-fat dairy each day. One serving is 1 cup (237 mL). 1 serving of nuts, seeds, or beans 5 times each week. 2-3 servings of heart-healthy fats. Healthy fats called omega-3 fatty acids are found in foods such as walnuts, flaxseeds, fortified milks, and eggs. These fats are also found in cold-water fish, such as sardines, salmon, and mackerel. Limit how much you eat of: Canned or prepackaged foods. Food that is high in trans fat, such as some fried foods. Food that is high in saturated fat, such as fatty meat. Desserts and other sweets, sugary drinks, and other foods with added  sugar. Full-fat dairy products. Do not salt foods before eating. Do not eat more than 4 egg yolks a week. Try to eat at least 2 vegetarian meals a week. Eat more home-cooked food and less restaurant, buffet, and fast food.  Lifestyle When eating at a restaurant, ask that your food be prepared with less salt or no salt, if possible. If you drink alcohol: Limit how much you use to: 0-1 drink a day for women who are not pregnant. 0-2 drinks a day for men. Be aware of how much alcohol is in your drink. In the [place] one drink equals one 12 oz bottle of beer (355 mL), one 5 oz glass of wine (148 mL), or one 1 oz glass of hard liquor (44 mL). General information Avoid eating more than 2,300 mg of salt a day. If you have hypertension, you may need to reduce your sodium intake to 1,500 mg a day. Work with your health care provider to maintain a healthy body weight or to lose weight. Ask what an ideal weight is for you. Get at least 30 minutes of exercise that causes your heart to beat faster (aerobic exercise) most days of the week. Activities may include walking, swimming, or biking. Work with your health care provider or dietitian to adjust your eating plan to your individual calorie needs. What foods should I eat? Fruits All fresh, dried, or frozen fruit. Canned fruit in natural juice (without addedsugar). Vegetables Fresh or frozen vegetables (raw, steamed, roasted, or grilled). Low-sodium or reduced-sodium tomato and vegetable juice. Low-sodium or reduced-sodium tomatosauce and tomato paste. Low-sodium or reduced-sodium canned vegetables. Grains Whole-grain or whole-wheat bread. Whole-grain or whole-wheat pasta. Brown rice. Orpah Cobb. Bulgur. Whole-grain and low-sodium cereals. Pita bread.Low-fat, low-sodium crackers. Whole-wheat flour tortillas. Meats and other proteins Skinless chicken or [place]. Ground chicken or [place]. Pork with fat trimmed off. Fish and seafood. Egg whites. Dried  beans, peas, or lentils. Unsalted nuts, nut butters, and seeds. Unsalted canned beans. Lean cuts of beef with fat trimmed off. Low-sodium, lean precooked or cured meat, such as sausages or meatloaves. Dairy Low-fat (1%) or fat-free (skim) milk. Reduced-fat, low-fat, or fat-free cheeses. Nonfat, low-sodium ricotta or cottage cheese. Low-fat or nonfatyogurt. Low-fat, low-sodium cheese. Fats and oils Soft margarine without trans fats. Vegetable oil. Reduced-fat, low-fat, or light mayonnaise and salad dressings (reduced-sodium). Canola, safflower, olive, avocado, soybean, andsunflower oils. Avocado. Seasonings and condiments Herbs. Spices. Seasoning mixes without salt. Other foods Unsalted popcorn and pretzels. Fat-free sweets. The items listed above may  not be a complete list of foods and beverages you can eat. Contact a dietitian for more information. What foods should I avoid? Fruits Canned fruit in a light or heavy syrup. Fried fruit. Fruit in cream or buttersauce. Vegetables Creamed or fried vegetables. Vegetables in a cheese sauce. Regular canned vegetables (not low-sodium or reduced-sodium). Regular canned tomato sauce and paste (not low-sodium or reduced-sodium). Regular tomato and vegetable juice(not low-sodium or reduced-sodium). Rosita Fire. Olives. Grains Baked goods made with fat, such as croissants, muffins, or some breads. Drypasta or rice meal packs. Meats and other proteins Fatty cuts of meat. Ribs. Fried meat. Tomasa Blase. Bologna, salami, and other precooked or cured meats, such as sausages or meat loaves. Fat from the back of a pig (fatback). Bratwurst. Salted nuts and seeds. Canned beans with added salt. Canned orsmoked fish. Whole eggs or egg yolks. Chicken or [place] with skin. Dairy Whole or 2% milk, cream, and half-and-half. Whole or full-fat cream cheese. Whole-fat or sweetened yogurt. Full-fat cheese. Nondairy creamers. Whippedtoppings. Processed cheese and cheese spreads. Fats and  oils Butter. Stick margarine. Lard. Shortening. Ghee. Bacon fat. Tropical oils, suchas coconut, palm kernel, or palm oil. Seasonings and condiments Onion salt, garlic salt, seasoned salt, table salt, and sea salt. Worcestershire sauce. Tartar sauce. Barbecue sauce. Teriyaki sauce. Soy sauce, including reduced-sodium. Steak sauce. Canned and packaged gravies. Fish sauce. Oyster sauce. Cocktail sauce. Store-bought horseradish. Ketchup. Mustard. Meat flavorings and tenderizers. Bouillon cubes. Hot sauces. Pre-made or packaged marinades. Pre-made or packaged taco seasonings. Relishes. Regular saladdressings. Other foods Salted popcorn and pretzels. The items listed above may not be a complete list of foods and beverages you should avoid. Contact a dietitian for more information. Where to find more information National Heart, Lung, and Blood Institute: PopSteam.is American Heart Association: www.heart.org Academy of Nutrition and Dietetics: www.eatright.org National Kidney Foundation: www.kidney.org Summary The DASH eating plan is a healthy eating plan that has been shown to reduce high blood pressure (hypertension). It may also reduce your risk for type 2 diabetes, heart disease, and stroke. When on the DASH eating plan, aim to eat more fresh fruits and vegetables, whole grains, lean proteins, low-fat dairy, and heart-healthy fats. With the DASH eating plan, you should limit salt (sodium) intake to 2,300 mg a day. If you have hypertension, you may need to reduce your sodium intake to 1,500 mg a day. Work with your health care provider or dietitian to adjust your eating plan to your individual calorie needs. This information is not intended to replace advice given to you by your health care provider. Make sure you discuss any questions you have with your healthcare provider. Document Revised: 08/31/2019 Document Reviewed: 08/31/2019 Elsevier Patient Education  2022 [employer].

## 2021-05-28 NOTE — Progress Notes (Signed)
Subjective:    Patient here for follow-up of elevated blood pressure.  She  is active at work, but no routine  exercising and is adherent to a low-salt diet.  Blood pressure  not checking at home unsure if   well controlled at home. Cardiac symptoms: none. Patient denies: chest pain, chest pressure/discomfort, fatigue, irregular heart beat, lower extremity edema, and tachypnea. Cardiovascular risk factors: hypertension. Use of agents associated with hypertension: none. History of target organ damage: none.    Review of Systems Review of Systems  Constitutional: Negative.   HENT: Negative.    Eyes: Negative.   Respiratory: Negative.    Cardiovascular: Negative.   Gastrointestinal: Negative.   Genitourinary: Negative.   Musculoskeletal: Negative.   Skin: Negative.   Neurological: Negative.   Endo/Heme/Allergies: Negative.   Psychiatric/Behavioral: Negative.        Objective:    Physical Exam Constitutional:      General: She is not in acute distress. Cardiovascular:     Rate and Rhythm: Normal rate and regular rhythm.  Pulmonary:     Effort: Pulmonary effort is normal.     Breath sounds: Normal breath sounds.  Skin:    General: Skin is warm and dry.  Neurological:     Mental Status: She is alert and oriented to person, place, and time.  Psychiatric:        Mood and Affect: Affect normal.      Assessment:    Hypertension, normal blood pressure . Evidence of target organ damage: none.    Plan:    Dietary sodium restriction. Regular aerobic exercise. Check blood pressures weekly and record. Follow up: 6 months and as needed.  Armenta Erskin S. Tanda Rockers, FNP-C 05/28/21

## 2021-07-18 ENCOUNTER — Other Ambulatory Visit (INDEPENDENT_AMBULATORY_CARE_PROVIDER_SITE_OTHER): Payer: Self-pay | Admitting: Primary Care

## 2021-07-18 DIAGNOSIS — Z760 Encounter for issue of repeat prescription: Secondary | ICD-10-CM

## 2021-07-18 DIAGNOSIS — I10 Essential (primary) hypertension: Secondary | ICD-10-CM

## 2021-07-18 NOTE — Telephone Encounter (Signed)
Requested medications are due for refill today requesting early  Requested medications are on the active medication list yes  Last refill 05/23/21, filled for 90 day supply  Last visit 05/28/21  Future visit scheduled 12/01/21  Notes to clinic requesting early, please assess.

## 2021-08-15 IMAGING — MG DIGITAL SCREENING BILAT W/ CAD
5 series · 5 of 5 positions shown · non-contrast
Comparison: None.

CLINICAL DATA: Screening.

EXAM:
DIGITAL SCREENING BILATERAL MAMMOGRAM WITH CAD

[R CC (1 of 2)]
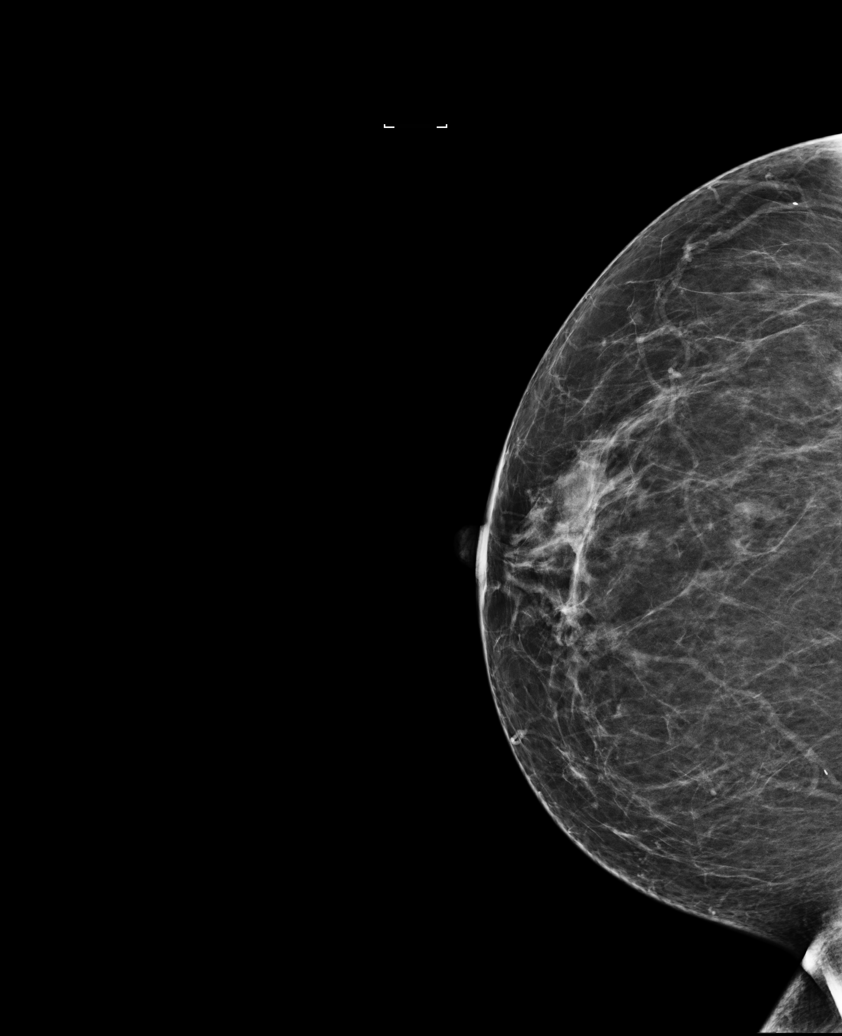

[L MLO]
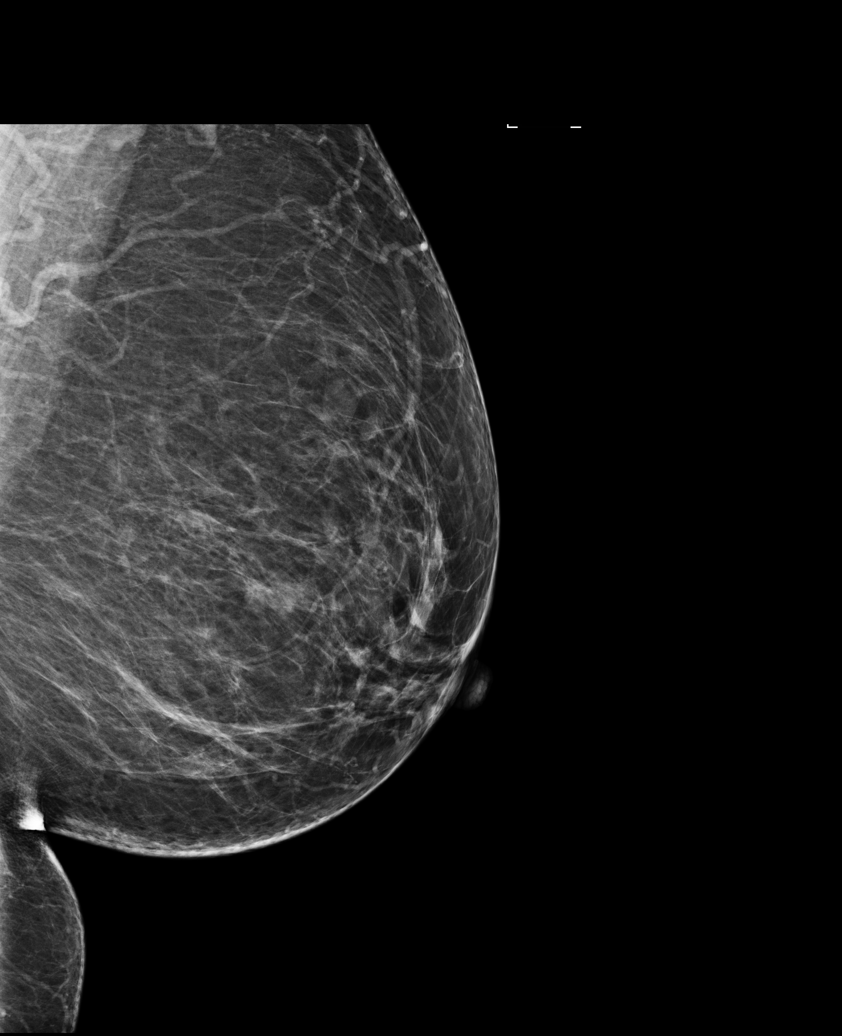

[L CC]
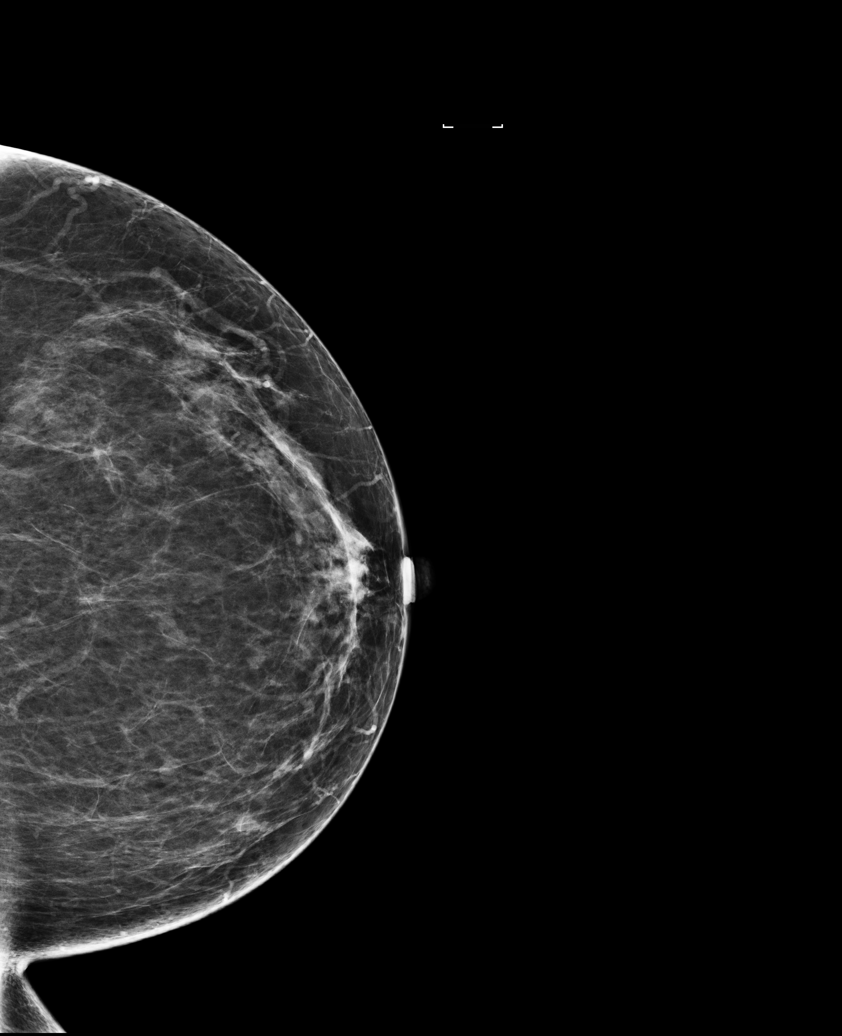

[R CC (2 of 2)]
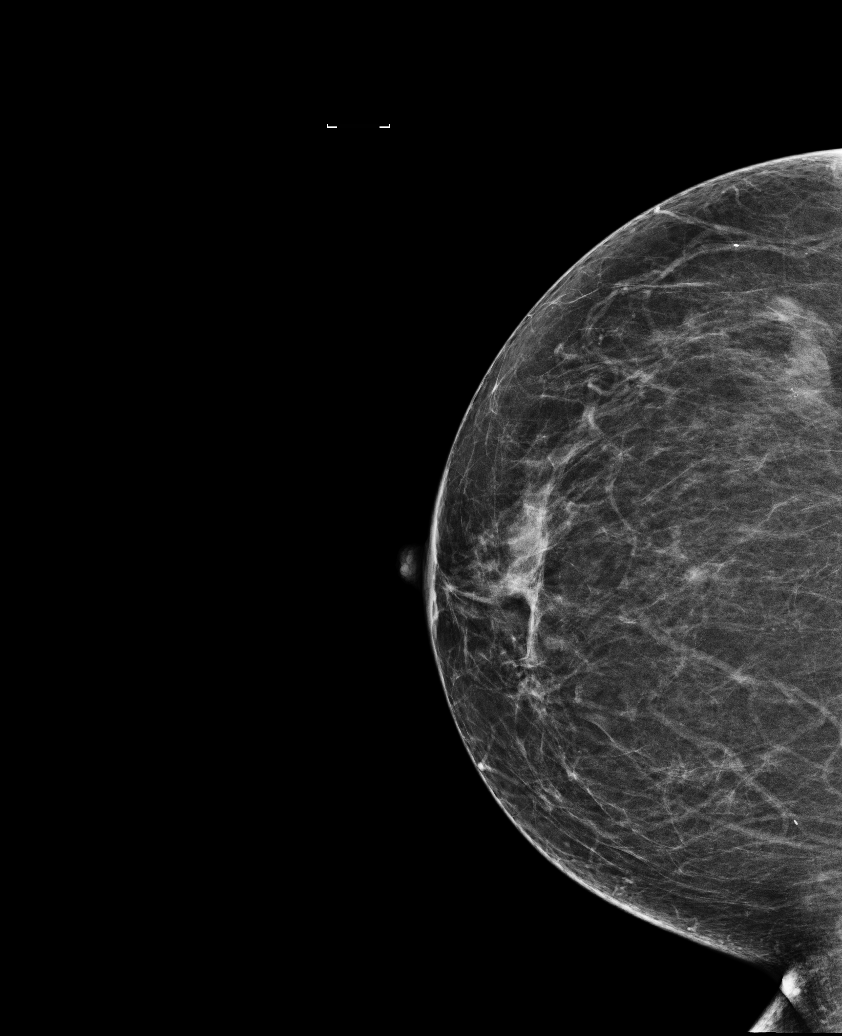

[R MLO]
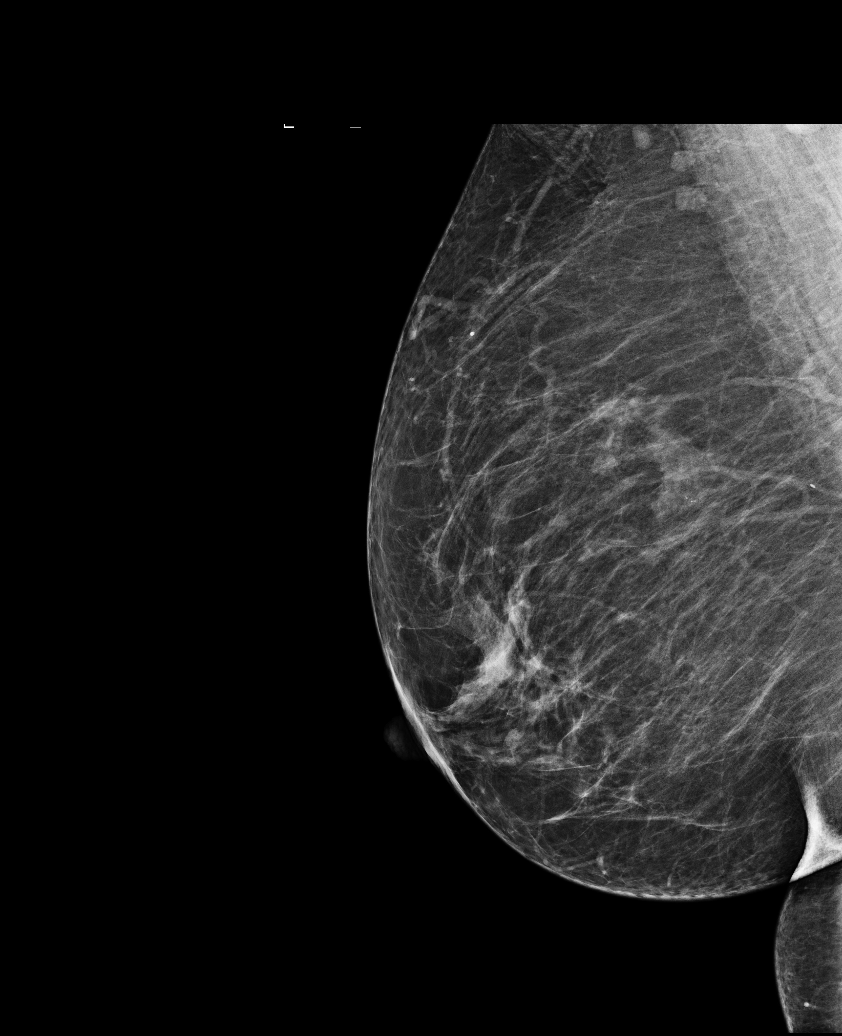

[5 of 5 positions shown; findings below may reference images not displayed]

ACR Breast Density Category b: There are scattered areas of
fibroglandular density.
FINDINGS: In the right breast, a possible asymmetry warrants further
evaluation. Also in the right breast, calcifications warrant further
evaluation. In the left breast, no findings suspicious for
malignancy. Images were processed with CAD.
IMPRESSION: Further evaluation is suggested for possible asymmetry in the right
breast.

RECOMMENDATION:
Diagnostic mammogram and possibly ultrasound of the right breast.
(Code:[id number])

The patient will be contacted regarding the findings, and additional
imaging will be scheduled.

BI-RADS CATEGORY  0: Incomplete. Need additional imaging evaluation
and/or prior mammograms for comparison.

## 2021-08-25 ENCOUNTER — Other Ambulatory Visit (INDEPENDENT_AMBULATORY_CARE_PROVIDER_SITE_OTHER): Payer: Self-pay | Admitting: Primary Care

## 2021-08-25 DIAGNOSIS — Z760 Encounter for issue of repeat prescription: Secondary | ICD-10-CM

## 2021-08-25 DIAGNOSIS — I10 Essential (primary) hypertension: Secondary | ICD-10-CM

## 2021-08-25 MED ORDER — AMLODIPINE BESYLATE 10 MG PO TABS: ORAL_TABLET | ORAL | 0 refills | Status: DC

## 2021-08-25 NOTE — Telephone Encounter (Signed)
Copied from CRM [id number]. Topic: Quick Communication - Rx Refill/Question >> Aug 25, 2021  9:52 AM Jaquita Rector A wrote: Medication: amLODipine (NORVASC) 10 MG tablet  Has the patient contacted their pharmacy? Yes.  No more refills  (Agent: If no, request that the patient contact the pharmacy for the refill. If patient does not wish to contact the pharmacy document the reason why and proceed with request.) (Agent: If yes, when and what did the pharmacy advise?)  Preferred Pharmacy (with phone number or street name): [facility] DR[facility]ORE #[id number] Gin[place] Otto, [place] - [address] AT [facility]  Phone:  [phone] Fax:  [phone]    Has the patient been seen for an appointment in the last year OR does the patient have an upcoming appointment? Yes.    Agent: Please be advised that RX refills may take up to 3 business days. We ask that you follow-up with your pharmacy.

## 2021-09-01 IMAGING — US US BREAST*R* LIMITED INC AXILLA
1 series · 9 of 9 positions shown · non-contrast
Comparison: Previous exam(s).

CLINICAL DATA: 58-year-old female presenting for screening recall
of a possible right at breast asymmetry and a group of
calcifications.

EXAM:
DIGITAL DIAGNOSTIC RIGHT MAMMOGRAM WITH CAD AND TOMO
ULTRASOUND RIGHT BREAST

[Series 1: us breast*right* limited inc axilla · 0.06mm/px · 9 of 9 slices shown]
[im 1/9]
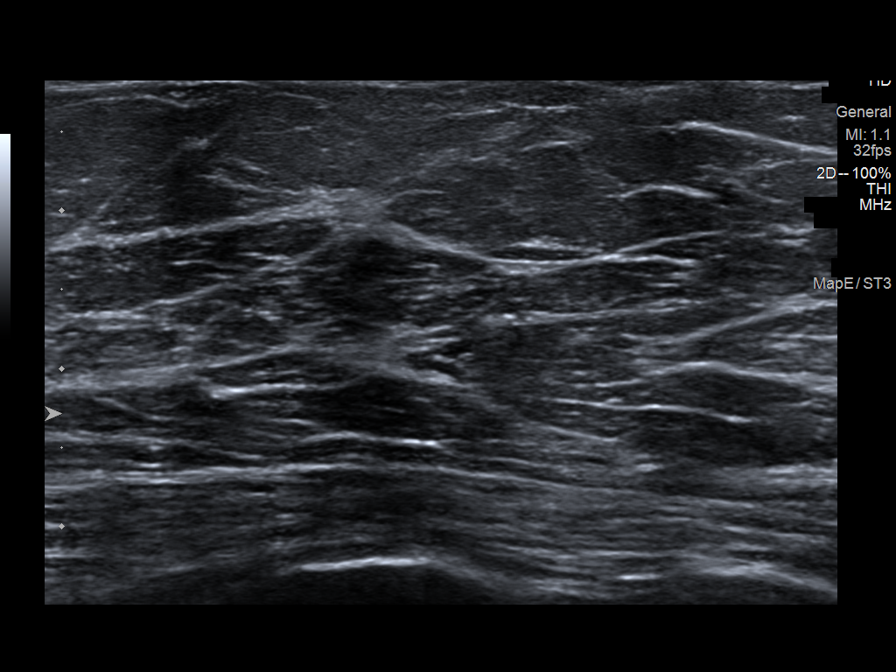
[im 2/9]
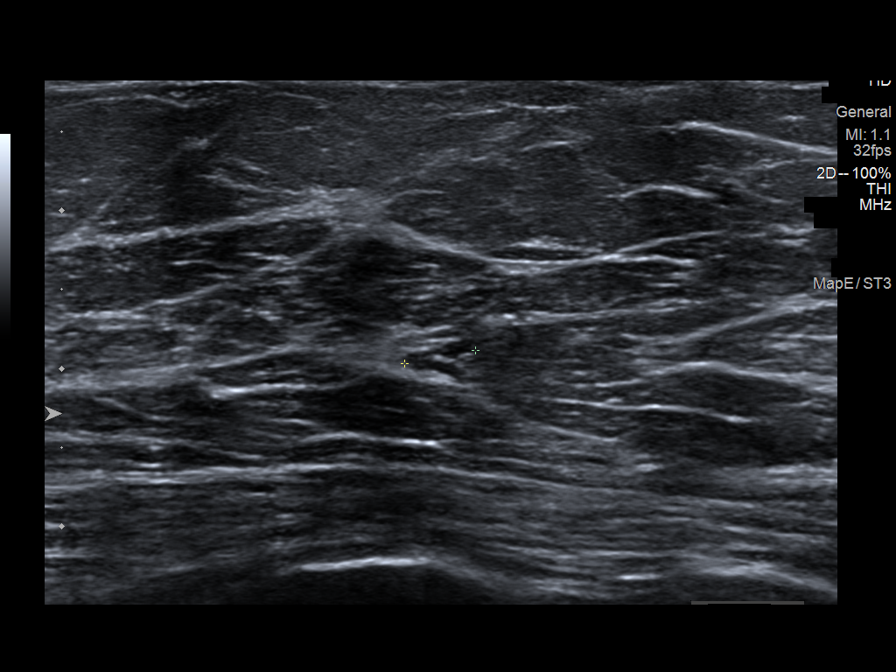
[im 3/9]
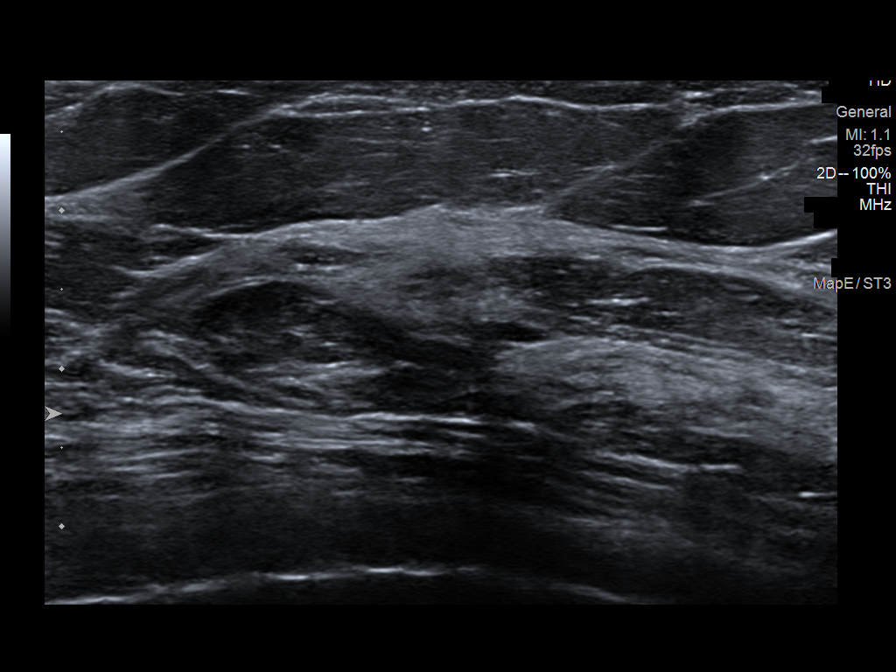
[im 4/9]
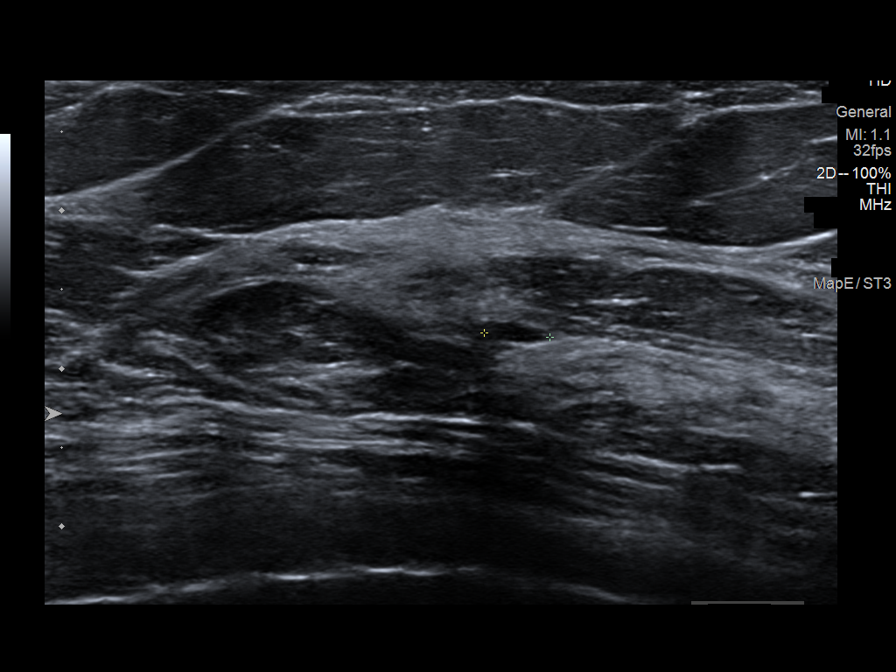
[im 5/9]
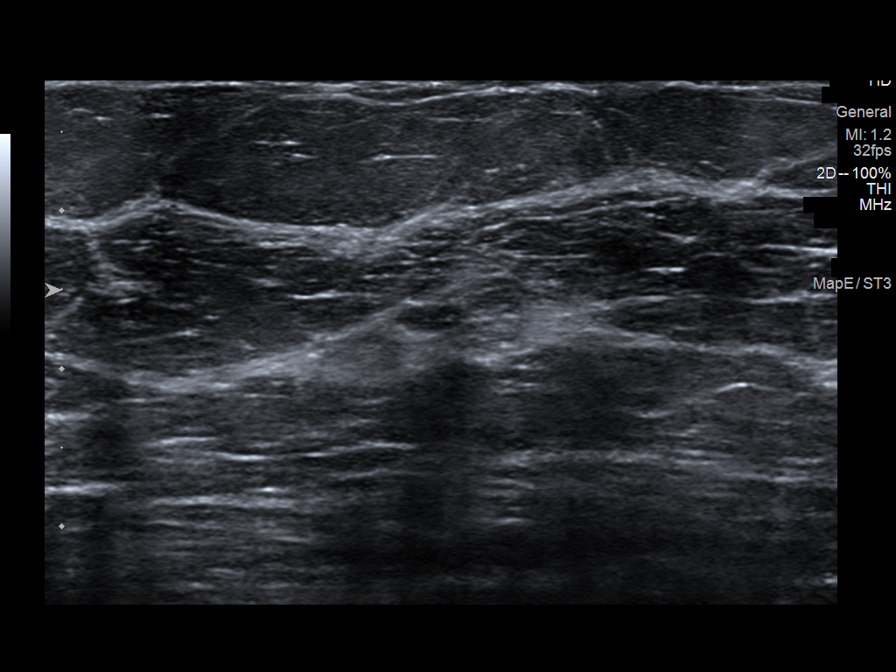
[im 6/9]
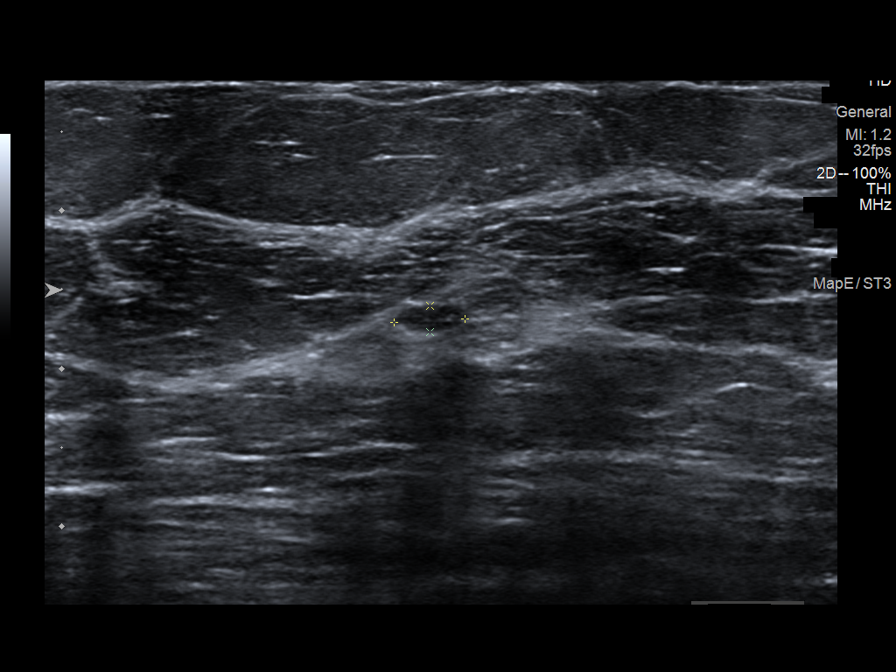
[im 7/9]
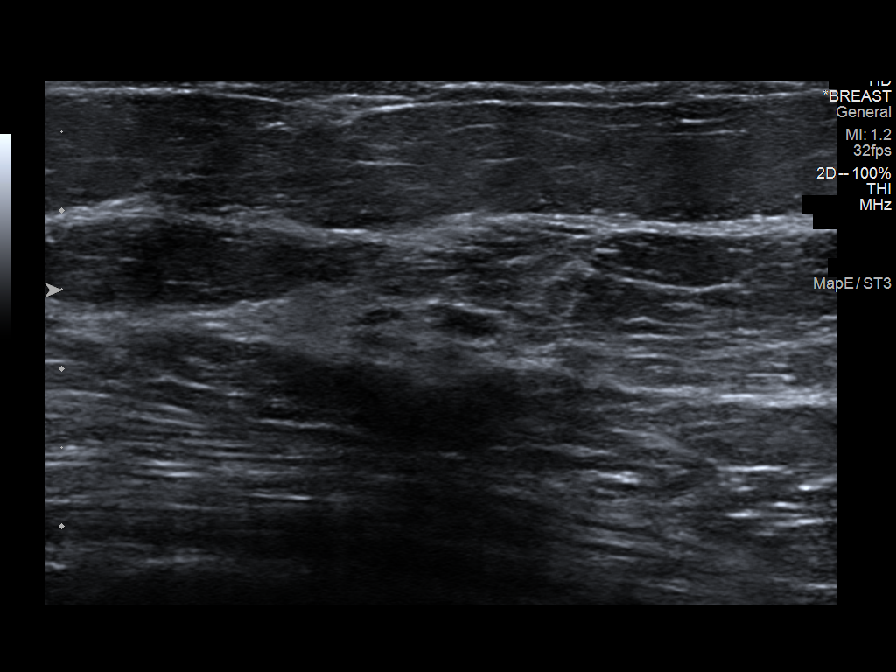
[im 8/9]
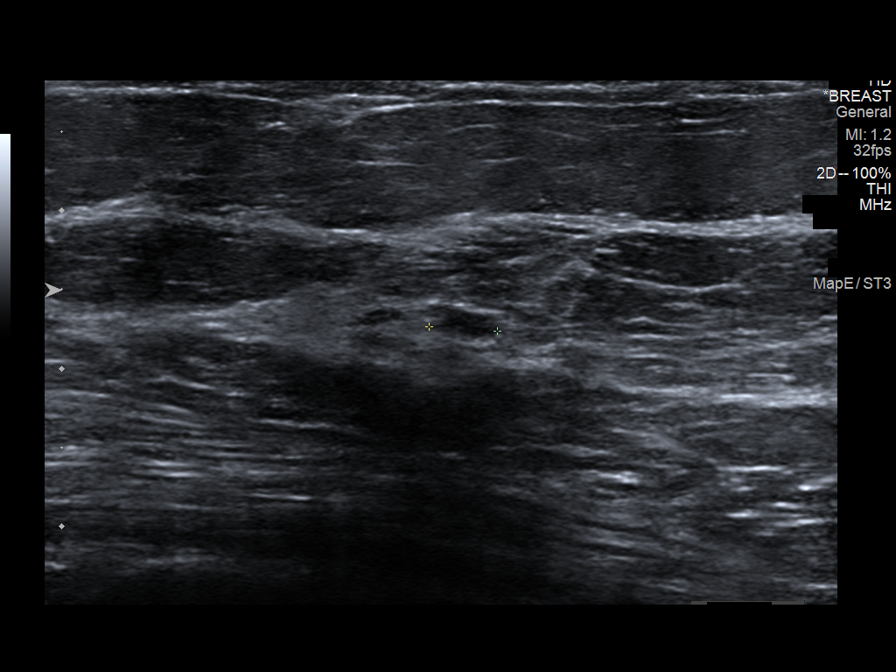
[im 9/9]
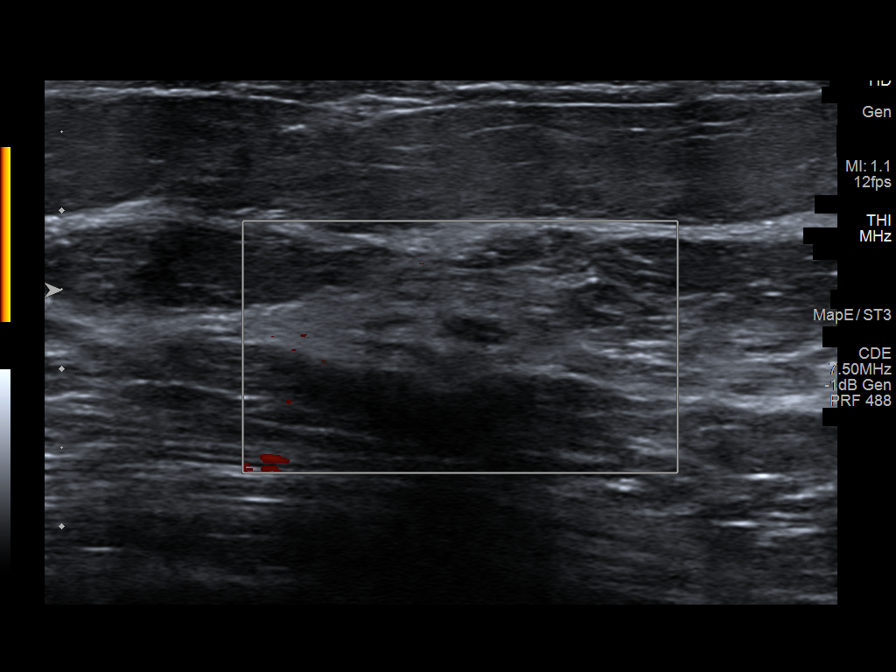

[9 of 9 positions shown; findings below may reference images not displayed]

ACR Breast Density Category b: There are scattered areas of
fibroglandular density.
FINDINGS: Spot compression magnification images of the calcifications in the
upper-outer right breast demonstrate that they are amorphous on the
CC view, and demonstrate tea cupping on the lateral view consistent
with benign milk of calcium.

There is a persistent asymmetry in the upper-outer quadrant of the
right breast, posterior depth about 1.5 cm lateral to the
calcifications on the CC view. There is also another focal asymmetry
in the superior middle depth of the right breast.

Mammographic images were processed with CAD.

Ultrasound targeted to the lateral aspect of the right breast
demonstrates a small focus of fibrocystic change at 9 o'clock, 5 cm
from the nipple measuring 5 mm. In the superior right breast at 12
o'clock, 5 cm from the nipple there is a hypoechoic oval
circumscribed mass measuring 5 x 2 x 4 mm, which may correspond with
the mammographic asymmetry.
IMPRESSION: 1. There are 2 likely benign asymmetries in the right breast, one in
the superior and the other in the lateral aspect of the breast.
There is a likely benign mass on ultrasound at 12 o'clock which may
correspond with the superior asymmetry.

2. The calcifications in the right breast are benign milk of
calcium.

RECOMMENDATION:
Six-month follow-up diagnostic right breast mammogram and ultrasound
is recommended to monitor the 2 right breast asymmetries and the
mass in the right breast at 12 o'clock seen on ultrasound.

I have discussed the findings and recommendations with the patient.
If applicable, a reminder letter will be sent to the patient
regarding the next appointment.

BI-RADS CATEGORY  3: Probably benign.

## 2021-10-16 ENCOUNTER — Other Ambulatory Visit (INDEPENDENT_AMBULATORY_CARE_PROVIDER_SITE_OTHER): Payer: Self-pay | Admitting: Primary Care

## 2021-10-16 DIAGNOSIS — I10 Essential (primary) hypertension: Secondary | ICD-10-CM

## 2021-10-16 DIAGNOSIS — Z760 Encounter for issue of repeat prescription: Secondary | ICD-10-CM

## 2021-11-24 ENCOUNTER — Other Ambulatory Visit (INDEPENDENT_AMBULATORY_CARE_PROVIDER_SITE_OTHER): Payer: Self-pay | Admitting: Primary Care

## 2021-11-24 DIAGNOSIS — I10 Essential (primary) hypertension: Secondary | ICD-10-CM

## 2021-11-24 DIAGNOSIS — Z760 Encounter for issue of repeat prescription: Secondary | ICD-10-CM

## 2021-11-24 NOTE — Telephone Encounter (Signed)
Medication Refill - Medication: amLODipine (NORVASC) 10 MG tablet  Pt stated she has 5 pills left.   Has the patient contacted their pharmacy? Yes.   No more refills.    (Agent: If yes, when and what did the pharmacy advise?)  Preferred Pharmacy (with phone number or street name):   [facility] DRUG STORE #[id number] Ginette Otto, [place] - [address] AT [facility]  [address] [place] [address]  Phone: [phone] Fax: [phone]  Hours: Not open 24 hours    Has the patient been seen for an appointment in the last year OR does the patient have an upcoming appointment? Yes.    Agent: Please be advised that RX refills may take up to 3 business days. We ask that you follow-up with your pharmacy.

## 2021-11-25 MED ORDER — AMLODIPINE BESYLATE 10 MG PO TABS: ORAL_TABLET | ORAL | 0 refills | Status: DC

## 2021-11-25 NOTE — Telephone Encounter (Signed)
Requested Prescriptions  Pending Prescriptions Disp Refills   amLODipine (NORVASC) 10 MG tablet 90 tablet 0    Sig: TAKE 1 TABLET(10 MG) BY MOUTH DAILY     Cardiovascular: Calcium Channel Blockers 2 Passed - 11/24/2021  4:25 PM      Passed - Last BP in normal range    BP Readings from Last 1 Encounters:  05/28/21 132/74         Passed - Last Heart Rate in normal range    Pulse Readings from Last 1 Encounters:  05/28/21 92         Passed - Valid encounter within last 6 months    Recent Outpatient Visits          6 months ago Essential hypertension   [facility] Ivonne Andrew, NP   12 months ago Prediabetes   [facility] Grayce Sessions, NP   1 year ago Syncope, unspecified syncope type   [facility] Grayce Sessions, NP   1 year ago Occult blood in stools   [facility] Grayce Sessions, NP   1 year ago Essential hypertension   [facility] Grayce Sessions, NP      Future Appointments            In 6 days Grayce Sessions, NP [facility]

## 2021-11-30 ENCOUNTER — Encounter (INDEPENDENT_AMBULATORY_CARE_PROVIDER_SITE_OTHER): Payer: 59 | Admitting: Primary Care

## 2021-12-01 ENCOUNTER — Other Ambulatory Visit: Payer: Self-pay

## 2021-12-01 ENCOUNTER — Ambulatory Visit (INDEPENDENT_AMBULATORY_CARE_PROVIDER_SITE_OTHER): Payer: PRIVATE HEALTH INSURANCE | Admitting: Primary Care

## 2021-12-01 ENCOUNTER — Encounter (INDEPENDENT_AMBULATORY_CARE_PROVIDER_SITE_OTHER): Payer: Self-pay | Admitting: Primary Care

## 2021-12-01 VITALS — BP 122/77 | HR 85 | Temp 97.8°F | Ht 67.0 in | Wt 193.6 lb

## 2021-12-01 DIAGNOSIS — Z760 Encounter for issue of repeat prescription: Secondary | ICD-10-CM

## 2021-12-01 DIAGNOSIS — I10 Essential (primary) hypertension: Secondary | ICD-10-CM

## 2021-12-01 DIAGNOSIS — Z1211 Encounter for screening for malignant neoplasm of colon: Secondary | ICD-10-CM

## 2021-12-01 DIAGNOSIS — Z131 Encounter for screening for diabetes mellitus: Secondary | ICD-10-CM | POA: Diagnosis not present

## 2021-12-01 LAB — POCT GLYCOSYLATED HEMOGLOBIN (HGB A1C): Hemoglobin A1C: 5.8 % — AB (ref 4.0–5.6)

## 2021-12-01 MED ORDER — HYDROCHLOROTHIAZIDE 25 MG PO TABS: 25.0000 mg | ORAL_TABLET | Freq: Every day | ORAL | 3 refills | Status: DC

## 2021-12-01 MED ORDER — LOSARTAN POTASSIUM 100 MG PO TABS: 100.0000 mg | ORAL_TABLET | Freq: Every day | ORAL | 3 refills | Status: DC

## 2021-12-01 MED ORDER — AMLODIPINE BESYLATE 10 MG PO TABS: ORAL_TABLET | ORAL | 1 refills | Status: DC

## 2021-12-01 NOTE — Patient Instructions (Signed)

## 2021-12-01 NOTE — Progress Notes (Signed)
BP 122/77 (BP Location: Right Arm, Patient Position: Sitting, Cuff Size: Large)    Pulse 85    Temp 97.8 F (36.6 C) (Oral)    Ht 5' 7" (1.702 m)    Wt 193 lb 9.6 oz (87.8 kg)    SpO2 99%    BMI 30.32 kg/m    Subjective:    Patient ID: Rebecca Huber, female    DOB: [birth year], 61 y.o.   MRN: [id number]  HPI: Rebecca Huber is a 61 y.o. female presenting on 12/01/2021 for comprehensive medical examination. Current medical complaints include: none Outpatient Encounter Medications as of 12/01/2021  Medication Sig   amLODipine (NORVASC) 10 MG tablet TAKE 1 TABLET(10 MG) BY MOUTH DAILY   hydrochlorothiazide (HYDRODIURIL) 25 MG tablet Take 1 tablet (25 mg total) by mouth daily.   losartan (COZAAR) 100 MG tablet Take 1 tablet (100 mg total) by mouth daily.   No facility-administered encounter medications on file as of 12/01/2021.    She currently lives with: Menopausal Symptoms: yes  Depression Screen done today and results listed below:  Depression screen [facility] 2/9 12/01/2021 05/28/2021 11/27/2020 09/08/2020 08/27/2020  Decreased Interest 0 0 0 0 0  Down, Depressed, Hopeless 0 0 0 0 0  PHQ - 2 Score 0 0 0 0 0  Altered sleeping - - - 0 0  Tired, decreased energy - - - 0 0  Change in appetite - - - 0 0  Feeling bad or failure about yourself  - - - 0 0  Trouble concentrating - - - 0 0  Moving slowly or fidgety/restless - - - 0 0  Suicidal thoughts - - - 0 0  PHQ-9 Score - - - 0 0    The patient does not have a history of falls. I did not complete a risk assessment for falls. A plan of care for falls was not documented.   Past Medical History:  No past medical history on file.  Surgical History:  No past surgical history on file.  Medications:  Current Outpatient Medications on File Prior to Visit  Medication Sig   amLODipine (NORVASC) 10 MG tablet TAKE 1 TABLET(10 MG) BY MOUTH DAILY   hydrochlorothiazide (HYDRODIURIL) 25 MG tablet Take 1 tablet (25 mg total) by mouth daily.    losartan (COZAAR) 100 MG tablet Take 1 tablet (100 mg total) by mouth daily.   No current facility-administered medications on file prior to visit.    Allergies:  No Known Allergies  Social History:  Social History   Socioeconomic History   Marital status: Married    Spouse name: Not on file   Number of children: Not on file   Years of education: Not on file   Highest education level: Not on file  Occupational History   Not on file  Tobacco Use   Smoking status: Never   Smokeless tobacco: Never  Substance and Sexual Activity   Alcohol use: Yes   Drug use: Never   Sexual activity: Not Currently  Other Topics Concern   Not on file  Social History Narrative   Not on file   Social Determinants of Health   Financial Resource Strain: Not on file  Food Insecurity: Not on file  Transportation Needs: Not on file  Physical Activity: Not on file  Stress: Not on file  Social Connections: Not on file  Intimate Partner Violence: Not on file   Social History   Tobacco Use  Smoking Status Never  Tobacco Never  ° °Social History  ° °Substance and Sexual Activity  °Alcohol Use Yes  ° ° °Family History:  °No family history on file. ° °Past medical history, surgical history, medications, allergies, family history and social history reviewed with patient today and changes made to appropriate areas of the chart.  ° °Review of Systems - History obtained from the patient °General ROS: negative °All other ROS negative except what is listed above and in the HPI.  ° °   °Objective:  °  °BP 122/77 (BP Location: Right Arm, Patient Position: Sitting, Cuff Size: Large)    Pulse 85    Temp 97.8 °F (36.6 °C) (Oral)    Ht 5' 7" (1.702 m)    Wt 193 lb 9.6 oz (87.8 kg)    SpO2 99%    BMI 30.32 kg/m²   °Wt Readings from Last 3 Encounters:  °12/01/21 193 lb 9.6 oz (87.8 kg)  °05/28/21 189 lb 3.2 oz (85.8 kg)  °11/27/20 182 lb 3.2 oz (82.6 kg)  ° °Physical exam: °General: Vital signs reviewed.   Patient is well-developed and well-nourished, 60 year old obese female in no acute distress and cooperative with exam. °Head: Normocephalic and atraumatic. °Eyes: EOMI, conjunctivae normal, no scleral icterus. °Neck: Supple, trachea midline, normal ROM, no JVD, masses, thyromegaly, or carotid bruit present. °Cardiovascular: RRR, S1 normal, S2 normal, no murmurs, gallops, or rubs. °Pulmonary/Chest: Clear to auscultation bilaterally, no wheezes, rales, or rhonchi. °Abdominal: Soft, non-tender, non-distended, BS +, no masses, organomegaly, or guarding present. °Musculoskeletal: No joint deformities, erythema, or stiffness, ROM full and nontender. °Extremities: No lower extremity edema bilaterally,  pulses symmetric and intact bilaterally. No cyanosis or clubbing. °Neurological: A&O x3, Strength is normal °Skin: Warm, dry and intact. No rashes or erythema. °Psychiatric: Normal mood and affect. speech and behavior is normal. Cognition and memory are normal. °   °Results for orders placed or performed in visit on 12/01/21  °HgB A1c  °Result Value Ref Range  ° Hemoglobin A1C 5.8 (A) 4.0 - 5.6 %  ° HbA1c POC (<> result, manual entry)    ° HbA1c, POC (prediabetic range)    ° HbA1c, POC (controlled diabetic range)    ° °   °Assessment & Plan:  ° °Problem List Items Addressed This Visit   °None °Visit Diagnoses   ° ° Screening for diabetes mellitus    -  Primary  ° Relevant Orders  ° HgB A1c (Completed)  ° Special screening for malignant neoplasms, colon      ° °  °  ° °Follow up plan: °LABORATORY TESTING:  °- Pap smear: pap done 12/19/19 ° °IMMUNIZATIONS:   °- Tdap: Tetanus vaccination status reviewed: last tetanus booster within 10 years. °- Influenza: Refused °- Pneumovax: Refused °- Prevnar: Refused °- HPV: Not applicable °- Zostavax vaccine: Refused ° °SCREENING: °-Mammogram:  01/25/20   °- Colonoscopy: Ordered today FOBT  °-  °PATIENT COUNSELING:   °Advised to take 1 mg of folate supplement per day if capable of pregnancy.   ° °Sexuality: Discussed sexually transmitted diseases, partner selection, use of condoms, avoidance of unintended pregnancy  and contraceptive alternatives.  ° °Advised to avoid cigarette smoking. ° °I discussed with the patient that most people either abstain from alcohol or drink within safe limits (<=14/week and <=4 drinks/occasion for males, <=7/weeks and <= 3 drinks/occasion for females) and that the risk for alcohol disorders and other health effects rises proportionally with the number of drinks per week and how often   a drinker exceeds daily limits. ° °Discussed cessation/primary prevention of drug use and availability of treatment for abuse.  ° °Diet: Encouraged to adjust caloric intake to maintain  or achieve ideal body weight, to reduce intake of dietary saturated fat and total fat, to limit sodium intake by avoiding high sodium foods and not adding table salt, and to maintain adequate dietary potassium and calcium preferably from fresh fruits, vegetables, and low-fat dairy products.   ° °stressed the importance of regular exercise ° °Injury prevention: Discussed safety belts, safety helmets, smoke detector, smoking near bedding or upholstery.  ° °Dental health: Discussed importance of regular tooth brushing, flossing, and dental visits.  ° °Abeeha was seen today for annual exam. ° °Diagnoses and all orders for this visit: ° °Screening for diabetes mellitus °-     HgB A1c 5.8 ° °Special screening for malignant neoplasms, colon °-     Fecal occult blood, imunochemical; Future °-     Cologuard ° °Medication refill °-     amLODipine (NORVASC) 10 MG tablet; TAKE 1 TABLET(10 MG) BY MOUTH DAILY °-     hydrochlorothiazide (HYDRODIURIL) 25 MG tablet; Take 1 tablet (25 mg total) by mouth daily. °-     losartan (COZAAR) 100 MG tablet; Take 1 tablet (100 mg total) by mouth daily. ° °Essential hypertension °BP goal -is met Explained that having normal blood pressure is the goal and medications are helping to get to  goal and maintain normal blood pressure. °DIET: Limit salt intake, read nutrition labels to check salt content, limit fried and high fatty foods  °Avoid using multisymptom OTC cold preparations that generally contain sudafed which can rise BP. Consult with pharmacist on best cold relief products to use for persons with HTN °EXERCISE °Discussed incorporating exercise such as walking - 30 minutes most days of the week and can do in 10 minute intervals    °-     amLODipine (NORVASC) 10 MG tablet; TAKE 1 TABLET(10 MG) BY MOUTH DAILY °-     hydrochlorothiazide (HYDRODIURIL) 25 MG tablet; Take 1 tablet (25 mg total) by mouth daily. °-     losartan (COZAAR) 100 MG tablet; Take 1 tablet (100 mg total) by mouth daily. °-     Lipid Panel °-     CMP14+EGFR °-     CBC with Differential ° °  °NEXT PREVENTATIVE PHYSICAL DUE IN 1 YEAR. ° ° °Michelle P Edwards  ° ° ° ° ° °  °

## 2021-12-02 LAB — CBC WITH DIFFERENTIAL/PLATELET
Basophils Absolute: 0.1 10*3/uL (ref 0.0–0.2)
Basos: 1 %
EOS (ABSOLUTE): 0.1 10*3/uL (ref 0.0–0.4)
Eos: 2 %
Hematocrit: 36.3 % (ref 34.0–46.6)
Hemoglobin: 11.7 g/dL (ref 11.1–15.9)
Immature Grans (Abs): 0 10*3/uL (ref 0.0–0.1)
Immature Granulocytes: 1 %
Lymphocytes Absolute: 1.9 10*3/uL (ref 0.7–3.1)
Lymphs: 28 %
MCH: 24.9 pg — ABNORMAL LOW (ref 26.6–33.0)
MCHC: 32.2 g/dL (ref 31.5–35.7)
MCV: 77 fL — ABNORMAL LOW (ref 79–97)
Monocytes Absolute: 0.6 10*3/uL (ref 0.1–0.9)
Monocytes: 9 %
Neutrophils Absolute: 4 10*3/uL (ref 1.4–7.0)
Neutrophils: 59 %
Platelets: 272 10*3/uL (ref 150–450)
RBC: 4.7 x10E6/uL (ref 3.77–5.28)
RDW: 14.9 % (ref 11.7–15.4)
WBC: 6.7 10*3/uL (ref 3.4–10.8)

## 2021-12-02 LAB — LIPID PANEL
Chol/HDL Ratio: 3.5 ratio (ref 0.0–4.4)
Cholesterol, Total: 207 mg/dL — ABNORMAL HIGH (ref 100–199)
HDL: 60 mg/dL (ref >39)
LDL Chol Calc (NIH): 132 mg/dL — ABNORMAL HIGH (ref 0–99)
Triglycerides: 84 mg/dL (ref 0–149)
VLDL Cholesterol Cal: 15 mg/dL (ref 5–40)

## 2021-12-02 LAB — CMP14+EGFR
ALT: 8 [IU]/L (ref 0–32)
AST: 15 [IU]/L (ref 0–40)
Albumin/Globulin Ratio: 1.7 (ref 1.2–2.2)
Albumin: 4.5 g/dL (ref 3.8–4.8)
Alkaline Phosphatase: 79 [IU]/L (ref 44–121)
BUN/Creatinine Ratio: 23 (ref 12–28)
BUN: 30 mg/dL — ABNORMAL HIGH (ref 8–27)
Bilirubin Total: 0.3 mg/dL (ref 0.0–1.2)
CO2: 22 mmol/L (ref 20–29)
Calcium: 9.1 mg/dL (ref 8.7–10.3)
Chloride: 103 mmol/L (ref 96–106)
Creatinine, Ser: 1.32 mg/dL — ABNORMAL HIGH (ref 0.57–1.00)
Globulin, Total: 2.6 g/dL (ref 1.5–4.5)
Glucose: 112 mg/dL — ABNORMAL HIGH (ref 70–99)
Potassium: 3.7 mmol/L (ref 3.5–5.2)
Sodium: 138 mmol/L (ref 134–144)
Total Protein: 7.1 g/dL (ref 6.0–8.5)
eGFR: 46 mL/min/{1.73_m2} — ABNORMAL LOW

## 2021-12-09 ENCOUNTER — Telehealth (INDEPENDENT_AMBULATORY_CARE_PROVIDER_SITE_OTHER): Payer: Self-pay

## 2021-12-09 ENCOUNTER — Other Ambulatory Visit (INDEPENDENT_AMBULATORY_CARE_PROVIDER_SITE_OTHER): Payer: Self-pay | Admitting: Primary Care

## 2021-12-09 DIAGNOSIS — E7800 Pure hypercholesterolemia, unspecified: Secondary | ICD-10-CM

## 2021-12-09 MED ORDER — ATORVASTATIN CALCIUM 20 MG PO TABS: 20.0000 mg | ORAL_TABLET | Freq: Every day | ORAL | 3 refills | Status: DC

## 2021-12-09 NOTE — Telephone Encounter (Signed)
-----   Message from Grayce Sessions, NP sent at 12/09/2021  1:43 PM EST ----- ?Labs are normal except ?Your cholesterol is high, Increase risk of heart attack and/or stroke.  ?To reduce your Cholesterol , Remember - more fruits and vegetables, more fish, and limit red meat and dairy products. ?More soy, nuts, beans, barley, lentils, oats and plant sterol ester enriched margarine instead of butter. ?I also encourage eliminating sugar and processed food. ?New script sent for atorvastatin 20mg  at bedtime  ?

## 2021-12-09 NOTE — Telephone Encounter (Signed)
Patient aware of normal labs except elevated cholesterol which can increase risk of stroke and heart attack. Increase fruits and veggies and decrease fats and fatty foods. Atorvastatin has been sent to pharmacy, take daily at bedtime. She verbalized understanding. Maryjean Morn, CMA  ?

## 2022-03-01 ENCOUNTER — Other Ambulatory Visit (INDEPENDENT_AMBULATORY_CARE_PROVIDER_SITE_OTHER): Payer: Self-pay | Admitting: Primary Care

## 2022-03-01 DIAGNOSIS — I10 Essential (primary) hypertension: Secondary | ICD-10-CM

## 2022-03-01 DIAGNOSIS — Z760 Encounter for issue of repeat prescription: Secondary | ICD-10-CM

## 2022-03-01 NOTE — Telephone Encounter (Signed)
Medication Refill - Medication: amLODipine (NORVASC) 10 MG tablet   Pt is completely out of her current supply   Has the patient contacted their pharmacy? Yes.   (Agent: If no, request that the patient contact the pharmacy for the refill. If patient does not wish to contact the pharmacy document the reason why and proceed with request.) (Agent: If yes, when and what did the pharmacy advise?)  Preferred Pharmacy (with phone number or street name):  [place], [place] - [address] [facility]  [place] [address]  Phone: [phone] Fax: [phone]   Has the patient been seen for an appointment in the last year OR does the patient have an upcoming appointment? Yes.    Agent: Please be advised that RX refills may take up to 3 business days. We ask that you follow-up with your pharmacy.

## 2022-03-02 NOTE — Telephone Encounter (Signed)
Last RF 12/01/21 #90 1 RF Requested Prescriptions  Refused Prescriptions Disp Refills  . amLODipine (NORVASC) 10 MG tablet 90 tablet 1    Sig: TAKE 1 TABLET(10 MG) BY MOUTH DAILY     Cardiovascular: Calcium Channel Blockers 2 Passed - 03/02/2022  8:02 AM      Passed - Last BP in normal range    BP Readings from Last 1 Encounters:  12/01/21 122/77         Passed - Last Heart Rate in normal range    Pulse Readings from Last 1 Encounters:  12/01/21 85         Passed - Valid encounter within last 6 months    Recent Outpatient Visits          3 months ago Screening for diabetes mellitus   [facility] Grayce Sessions, NP   9 months ago Essential hypertension   [facility] Ivonne Andrew, NP   1 year ago Prediabetes   [facility] Grayce Sessions, NP   1 year ago Syncope, unspecified syncope type   [facility] Grayce Sessions, NP   1 year ago Occult blood in stools   [facility] Grayce Sessions, NP      Future Appointments            In 3 months Randa Evens, Kinnie Scales, NP [facility]

## 2022-04-14 ENCOUNTER — Ambulatory Visit (INDEPENDENT_AMBULATORY_CARE_PROVIDER_SITE_OTHER): Payer: Self-pay

## 2022-04-14 NOTE — Telephone Encounter (Signed)
  Chief Complaint: syncope Symptoms: syncope Frequency: 04/10/22 Pertinent Negatives: Patient denies any symptoms before or after Disposition: [] ED /[x] Urgent Care (no appt availability in office) / [] Appointment(In office/virtual)/ []  [place] [facility]/ [] Home Care/ [] Refused Recommended Disposition /[] [facility]/ []  Follow-up with PCP Additional Notes: pt had syncope episode Saturday. Family caught pt so no injuries occurred. Pt has no symptoms before or after episode. Advised her no appts available and recommended UC. Called and spoke with [facility] who advised the same. Pt wanted to schedule for fu appt so appt made for 05/10/22 at 1610 and added to wait list.   Reason for Disposition  [1] Age > 50 years  AND [2] now alert and feels fine  Answer Assessment - Initial Assessment Questions 1. ONSET: "How long were you unconscious?" (minutes) "When did it happen?"     Saturday 2. CONTENT: "What happened during period of unconsciousness?" (e.g., seizure activity)      Nothing  3. MENTAL STATUS: "Alert and oriented now?" (oriented x 3 = name, month, location)      yes 4. TRIGGER: "What do you think caused the fainting?" "What were you doing just before you fainted?"  (e.g., exercise, sudden standing up, prolonged standing)     Unsure   7. CARDIAC SYMPTOMS: "Have you had any of the following symptoms: chest pain, difficulty breathing, palpitations?"     BP was elevated when EMS checked her out  Protocols used: Fainting-A-AH

## 2022-05-10 ENCOUNTER — Encounter (INDEPENDENT_AMBULATORY_CARE_PROVIDER_SITE_OTHER): Payer: Self-pay | Admitting: Primary Care

## 2022-05-10 ENCOUNTER — Ambulatory Visit (INDEPENDENT_AMBULATORY_CARE_PROVIDER_SITE_OTHER): Payer: PRIVATE HEALTH INSURANCE | Admitting: Primary Care

## 2022-05-10 VITALS — BP 133/77 | HR 102 | Temp 98.5°F | Ht 67.0 in | Wt 190.3 lb

## 2022-05-10 DIAGNOSIS — R55 Syncope and collapse: Secondary | ICD-10-CM | POA: Diagnosis not present

## 2022-05-10 NOTE — Progress Notes (Signed)
[facility]  Rebecca Huber, is a 61 y.o. female  LGX:[id number]  CXK:[id number]  DOB - Sep 11, 1961  Chief Complaint  Patient presents with   Follow-up    Fainting episodes happened twice        Subjective:   Rebecca Huber is a 61 y.o. female here today for a follow up visit for near syncope episodes. . This has previously happen in the past PCP recalled she was cooking no ventilation in the kitchen became over heated and she had a syncope episode. When I reminded her she remember and said that's what happen this time I was outside with the family and got real hot .Patient son was present [name] who she gave permission to be present. [name] stated her eyes rolled in the back of her head and was drooling with some body movement. Any hx of seizure disorder no and the paramedics said it was not a seizure. [name] jokingly said your the reason why my mom stop cooking.  Patient has No headache, No chest pain, No abdominal pain - No Nausea, No new weakness tingling or numbness, No Cough - shortness of breath  No problems updated.  No Known Allergies  History reviewed. No pertinent past medical history.  Current Outpatient Medications on File Prior to Visit  Medication Sig Dispense Refill   amLODipine (NORVASC) 10 MG tablet TAKE 1 TABLET(10 MG) BY MOUTH DAILY 90 tablet 1   atorvastatin (LIPITOR) 20 MG tablet Take 1 tablet (20 mg total) by mouth daily. 90 tablet 3   hydrochlorothiazide (HYDRODIURIL) 25 MG tablet Take 1 tablet (25 mg total) by mouth daily. 90 tablet 3   losartan (COZAAR) 100 MG tablet Take 1 tablet (100 mg total) by mouth daily. 90 tablet 3   No current facility-administered medications on file prior to visit.    Objective:   Vitals:   05/10/22 1616 05/10/22 1625  BP: (!) 153/83 133/77  Pulse: (!) 102   Temp: 98.5 F (36.9 C)   TempSrc: Oral   SpO2: 98%   Weight: 190 lb 4.8 oz (86.3 kg)   Height: 5\' 7"  (1.702 m)     Exam General appearance :  Awake, alert, not in any distress. Speech Clear. Not toxic looking HEENT: Atraumatic and Normocephalic, pupils equally reactive to light and accomodation Neck: Supple, no JVD. No cervical lymphadenopathy.  Chest: Good air entry bilaterally, no added sounds  CVS: S1 S2 regular, no murmurs.  Abdomen: Bowel sounds present, Non tender and not distended with no gaurding, rigidity or rebound. Extremities: B/L Lower Ext shows no edema, both legs are warm to touch Neurology: Awake alert, and oriented X 3, , Non focal Skin: No Rash  Data Review Lab Results  Component Value Date   HGBA1C 5.8 (A) 12/01/2021   HGBA1C 5.2 11/27/2020   HGBA1C 6.0 (H) 09/06/2019    Assessment & Plan  [name] was seen today for follow-up.  Diagnoses and all orders for this visit:  Syncope, unspecified syncope type All s/s present   Symptoms of heat syncope include: Fainting (short duration) Dizziness. Light-headedness from standing too long or suddenly rising from a sitting or lying position. Advised to try to stay out of the heat and monitor air quality . If another incident occurs will refer to neurology   Patient have been counseled extensively about nutrition and exercise. Other issues discussed during this visit include: low cholesterol diet, weight control and daily exercise, foot care, annual eye examinations at Ophthalmology, importance of adherence with medications  and regular follow-up. We also discussed long term complications of uncontrolled diabetes and hypertension.   Return in about 5 weeks (around 06/14/2022) for Bp ck, syncope/fasting.  The patient was given clear instructions to go to ER or return to medical center if symptoms don't improve, worsen or new problems develop. The patient verbalized understanding. The patient was told to call to get lab results if they haven't heard anything in the next week.   This note has been created with Personnel officer. Any transcriptional errors are unintentional.   Grayce Sessions, NP 05/17/2022, 10:13 PM

## 2022-05-10 NOTE — Patient Instructions (Signed)
Symptoms of heat syncope include: Fainting (short duration) Dizziness. Light-headedness from standing too long or suddenly rising from a sitting or lying position.

## 2022-06-01 ENCOUNTER — Ambulatory Visit (INDEPENDENT_AMBULATORY_CARE_PROVIDER_SITE_OTHER): Payer: PRIVATE HEALTH INSURANCE | Admitting: Primary Care

## 2022-07-05 ENCOUNTER — Other Ambulatory Visit (INDEPENDENT_AMBULATORY_CARE_PROVIDER_SITE_OTHER): Payer: Self-pay | Admitting: Primary Care

## 2022-07-05 DIAGNOSIS — Z760 Encounter for issue of repeat prescription: Secondary | ICD-10-CM

## 2022-07-05 DIAGNOSIS — I10 Essential (primary) hypertension: Secondary | ICD-10-CM

## 2022-07-05 NOTE — Telephone Encounter (Signed)
Requested Prescriptions  Pending Prescriptions Disp Refills  . amLODipine (NORVASC) 10 MG tablet [Pharmacy Med Name: AMLODIPINE BESYLATE 10MG  TABLETS] 90 tablet 1    Sig: TAKE 1 TABLET(10 MG) BY MOUTH DAILY     Cardiovascular: Calcium Channel Blockers 2 Passed - 07/05/2022  6:38 AM      Passed - Last BP in normal range    BP Readings from Last 1 Encounters:  05/10/22 133/77         Passed - Last Heart Rate in normal range    Pulse Readings from Last 1 Encounters:  05/10/22 (!) 102         Passed - Valid encounter within last 6 months    Recent Outpatient Visits          1 month ago Syncope, unspecified syncope type   [place] Kerin Perna, NP   7 months ago Screening for diabetes mellitus   [place] Kerin Perna, NP   1 year ago Essential hypertension   [place] Foy, NP   1 year ago Prediabetes   [place] Kerin Perna, NP   1 year ago Syncope, unspecified syncope type   [place] Kerin Perna, NP      Future Appointments            In 3 days Kerin Perna, NP [place]

## 2022-07-08 ENCOUNTER — Ambulatory Visit (INDEPENDENT_AMBULATORY_CARE_PROVIDER_SITE_OTHER): Payer: PRIVATE HEALTH INSURANCE | Admitting: Primary Care

## 2022-07-08 ENCOUNTER — Encounter (INDEPENDENT_AMBULATORY_CARE_PROVIDER_SITE_OTHER): Payer: Self-pay | Admitting: Primary Care

## 2022-07-08 VITALS — BP 129/76 | HR 85 | Resp 16 | Wt 191.6 lb

## 2022-07-08 DIAGNOSIS — I10 Essential (primary) hypertension: Secondary | ICD-10-CM

## 2022-07-08 DIAGNOSIS — E7800 Pure hypercholesterolemia, unspecified: Secondary | ICD-10-CM | POA: Diagnosis not present

## 2022-07-08 DIAGNOSIS — Z1211 Encounter for screening for malignant neoplasm of colon: Secondary | ICD-10-CM

## 2022-07-08 DIAGNOSIS — Z1231 Encounter for screening mammogram for malignant neoplasm of breast: Secondary | ICD-10-CM

## 2022-07-08 DIAGNOSIS — Z0000 Encounter for general adult medical examination without abnormal findings: Secondary | ICD-10-CM

## 2022-07-08 DIAGNOSIS — E6609 Other obesity due to excess calories: Secondary | ICD-10-CM

## 2022-07-08 DIAGNOSIS — Z6830 Body mass index (BMI) 30.0-30.9, adult: Secondary | ICD-10-CM

## 2022-07-08 NOTE — Progress Notes (Signed)
[place]  Rebecca Huber, is a 61 y.o. female  [id number]  [id number]  DOB - [birth year]  Chief Complaint  Patient presents with   Hypertension       Subjective:   Rebecca Huber is a 61 y.o. female here today for a follow up visit for management of HTN. Patient has No headache, No chest pain, No abdominal pain - No Nausea, No new weakness tingling or numbness, No Cough - shortness of breath  No problems updated.  No Known Allergies  History reviewed. No pertinent past medical history.  Current Outpatient Medications on File Prior to Visit  Medication Sig Dispense Refill   amLODipine (NORVASC) 10 MG tablet TAKE 1 TABLET(10 MG) BY MOUTH DAILY 90 tablet 1   atorvastatin (LIPITOR) 20 MG tablet Take 1 tablet (20 mg total) by mouth daily. 90 tablet 3   hydrochlorothiazide (HYDRODIURIL) 25 MG tablet Take 1 tablet (25 mg total) by mouth daily. 90 tablet 3   losartan (COZAAR) 100 MG tablet Take 1 tablet (100 mg total) by mouth daily. 90 tablet 3   No current facility-administered medications on file prior to visit.  Comprehensive ROS Pertinent positive and negative noted in HPI    Objective:   Vitals:   07/08/22 0925  BP: 129/76  Pulse: 85  Resp: 16  SpO2: 99%  Weight: 191 lb 9.6 oz (86.9 kg)    Exam General appearance : Awake, alert,obese female  not in any distress. Speech Clear. Not toxic looking HEENT: Atraumatic and Normocephalic, pupils equally reactive to light and accomodation( lift side of face check to mouth slight twitching) patient unaware she will monitor it. Neck: Supple, no JVD. No cervical lymphadenopathy.  Chest: Good air entry bilaterally, no added sounds  CVS: S1 S2 regular, no murmurs.  Abdomen: Bowel sounds present, Non tender and not distended with no gaurding, rigidity or rebound. Extremities: B/L Lower Ext shows no edema, both legs are warm to touch Neurology: Awake alert, and oriented X 3,  Non focal Skin: No Rash  Data  Review Lab Results  Component Value Date   HGBA1C 5.8 (A) 12/01/2021   HGBA1C 5.2 11/27/2020   HGBA1C 6.0 (H) 09/06/2019    Assessment & Plan  Rebecca Huber was seen today for hypertension.  Diagnoses and all orders for this visit:  Essential hypertension BP goal - < 140/90 She has a normal blood pressure at goal and medications are helping to get to goal and maintain normal blood pressure. DIET: Limit salt intake, read nutrition labels to check salt content, limit fried and high fatty foods  Avoid using multisymptom OTC cold preparations that generally contain sudafed which can rise BP. Consult with pharmacist on best cold relief products to use for persons with HTN EXERCISE Discussed incorporating exercise such as walking - 30 minutes most days of the week and can do in 10 minute intervals    -     CMP14+EGFR  Encounter for screening mammogram for malignant neoplasm of breast -     MM DIGITAL SCREENING BILATERAL; Future  Elevated LDL cholesterol level  Healthy lifestyle diet of fruits vegetables fish nuts whole grains and low saturated fat . Foods high in cholesterol or liver, fatty meats,cheese, butter avocados, nuts and seeds, chocolate and fried foods. On atorvastatin 42m  -     Lipid panel  Healthcare maintenance -     Ambulatory referral to Gastroenterology -     HCV Ab w Reflex to Quant PCR  Colon cancer screening -  Ambulatory referral to Gastroenterology   Class 1 obesity due to excess calories without serious comorbidity with body mass index (BMI) of 30.0 to 30.9 in adult Obesity is 30-39 indicating an excess in caloric intake or underlining conditions. This may lead to other co-morbidities. Lifestyle modifications of diet and exercise may reduce obesity.    Patient have been counseled extensively about nutrition and exercise. Other issues discussed during this visit include: low cholesterol diet, weight control and daily exercise, foot care, annual eye examinations at  Ophthalmology, importance of adherence with medications and regular follow-up. We also discussed long term complications of uncontrolled diabetes and hypertension.   Return in about 6 months (around 01/06/2023) for HTN/Prediabetes /fasting labs .  The patient was given clear instructions to go to ER or return to medical center if symptoms don't improve, worsen or new problems develop. The patient verbalized understanding. The patient was told to call to get lab results if they haven't heard anything in the next week.   This note has been created with Surveyor, quantity. Any transcriptional errors are unintentional.   Kerin Perna, NP 07/08/2022, 9:55 AM

## 2022-07-08 NOTE — Patient Instructions (Signed)
Calorie Counting for Weight Loss Calories are units of energy. Your body needs a certain number of calories from food to keep going throughout the day. When you eat or drink more calories than your body needs, your body stores the extra calories mostly as fat. When you eat or drink fewer calories than your body needs, your body burns fat to get the energy it needs. Calorie counting means keeping track of how many calories you eat and drink each day. Calorie counting can be helpful if you need to lose weight. If you eat fewer calories than your body needs, you should lose weight. Ask your health care provider what a healthy weight is for you. For calorie counting to work, you will need to eat the right number of calories each day to lose a healthy amount of weight per week. A dietitian can help you figure out how many calories you need in a day and will suggest ways to reach your calorie goal. A healthy amount of weight to lose each week is usually 1-2 lb (0.5-0.9 kg). This usually means that your daily calorie intake should be reduced by 500-750 calories. Eating 1,200-1,500 calories a day can help most women lose weight. Eating 1,500-1,800 calories a day can help most men lose weight. What do I need to know about calorie counting? Work with your health care provider or dietitian to determine how many calories you should get each day. To meet your daily calorie goal, you will need to: Find out how many calories are in each food that you would like to eat. Try to do this before you eat. Decide how much of the food you plan to eat. Keep a food log. Do this by writing down what you ate and how many calories it had. To successfully lose weight, it is important to balance calorie counting with a healthy lifestyle that includes regular activity. Where do I find calorie information?  The number of calories in a food can be found on a Nutrition Facts label. If a food does not have a Nutrition Facts label, try  to look up the calories online or ask your dietitian for help. Remember that calories are listed per serving. If you choose to have more than one serving of a food, you will have to multiply the calories per serving by the number of servings you plan to eat. For example, the label on a package of bread might say that a serving size is 1 slice and that there are 90 calories in a serving. If you eat 1 slice, you will have eaten 90 calories. If you eat 2 slices, you will have eaten 180 calories. How do I keep a food log? After each time that you eat, record the following in your food log as soon as possible: What you ate. Be sure to include toppings, sauces, and other extras on the food. How much you ate. This can be measured in cups, ounces, or number of items. How many calories were in each food and drink. The total number of calories in the food you ate. Keep your food log near you, such as in a pocket-sized notebook or on an app or website on your mobile phone. Some programs will calculate calories for you and show you how many calories you have left to meet your daily goal. What are some portion-control tips? Know how many calories are in a serving. This will help you know how many servings you can have of a certain   food. Use a measuring cup to measure serving sizes. You could also try weighing out portions on a kitchen scale. With time, you will be able to estimate serving sizes for some foods. Take time to put servings of different foods on your favorite plates or in your favorite bowls and cups so you know what a serving looks like. Try not to eat straight from a food's packaging, such as from a bag or box. Eating straight from the package makes it hard to see how much you are eating and can lead to overeating. Put the amount you would like to eat in a cup or on a plate to make sure you are eating the right portion. Use smaller plates, glasses, and bowls for smaller portions and to prevent  overeating. Try not to multitask. For example, avoid watching TV or using your computer while eating. If it is time to eat, sit down at a table and enjoy your food. This will help you recognize when you are full. It will also help you be more mindful of what and how much you are eating. What are tips for following this plan? Reading food labels Check the calorie count compared with the serving size. The serving size may be smaller than what you are used to eating. Check the source of the calories. Try to choose foods that are high in protein, fiber, and vitamins, and low in saturated fat, trans fat, and sodium. Shopping Read nutrition labels while you shop. This will help you make healthy decisions about which foods to buy. Pay attention to nutrition labels for low-fat or fat-free foods. These foods sometimes have the same number of calories or more calories than the full-fat versions. They also often have added sugar, starch, or salt to make up for flavor that was removed with the fat. Make a grocery list of lower-calorie foods and stick to it. Cooking Try to cook your favorite foods in a healthier way. For example, try baking instead of frying. Use low-fat dairy products. Meal planning Use more fruits and vegetables. One-half of your plate should be fruits and vegetables. Include lean proteins, such as chicken, turkey, and fish. Lifestyle Each week, aim to do one of the following: 150 minutes of moderate exercise, such as walking. 75 minutes of vigorous exercise, such as running. General information Know how many calories are in the foods you eat most often. This will help you calculate calorie counts faster. Find a way of tracking calories that works for you. Get creative. Try different apps or programs if writing down calories does not work for you. What foods should I eat?  Eat nutritious foods. It is better to have a nutritious, high-calorie food, such as an avocado, than a food with  few nutrients, such as a bag of potato chips. Use your calories on foods and drinks that will fill you up and will not leave you hungry soon after eating. Examples of foods that fill you up are nuts and nut butters, vegetables, lean proteins, and high-fiber foods such as whole grains. High-fiber foods are foods with more than 5 g of fiber per serving. Pay attention to calories in drinks. Low-calorie drinks include water and unsweetened drinks. The items listed above may not be a complete list of foods and beverages you can eat. Contact a dietitian for more information. What foods should I limit? Limit foods or drinks that are not good sources of vitamins, minerals, or protein or that are high in unhealthy fats. These   include: Candy. Other sweets. Sodas, specialty coffee drinks, alcohol, and juice. The items listed above may not be a complete list of foods and beverages you should avoid. Contact a dietitian for more information. How do I count calories when eating out? Pay attention to portions. Often, portions are much larger when eating out. Try these tips to keep portions smaller: Consider sharing a meal instead of getting your own. If you get your own meal, eat only half of it. Before you start eating, ask for a container and put half of your meal into it. When available, consider ordering smaller portions from the menu instead of full portions. Pay attention to your food and drink choices. Knowing the way food is cooked and what is included with the meal can help you eat fewer calories. If calories are listed on the menu, choose the lower-calorie options. Choose dishes that include vegetables, fruits, whole grains, low-fat dairy products, and lean proteins. Choose items that are boiled, broiled, grilled, or steamed. Avoid items that are buttered, battered, fried, or served with cream sauce. Items labeled as crispy are usually fried, unless stated otherwise. Choose water, low-fat milk,  unsweetened iced tea, or other drinks without added sugar. If you want an alcoholic beverage, choose a lower-calorie option, such as a glass of wine or light beer. Ask for dressings, sauces, and syrups on the side. These are usually high in calories, so you should limit the amount you eat. If you want a salad, choose a garden salad and ask for grilled meats. Avoid extra toppings such as bacon, cheese, or fried items. Ask for the dressing on the side, or ask for olive oil and vinegar or lemon to use as dressing. Estimate how many servings of a food you are given. Knowing serving sizes will help you be aware of how much food you are eating at restaurants. Where to find more information Centers for Disease Control and Prevention: www.cdc.gov U.[place]. Department of Agriculture: myplate.gov Summary Calorie counting means keeping track of how many calories you eat and drink each day. If you eat fewer calories than your body needs, you should lose weight. A healthy amount of weight to lose per week is usually 1-2 lb (0.5-0.9 kg). This usually means reducing your daily calorie intake by 500-750 calories. The number of calories in a food can be found on a Nutrition Facts label. If a food does not have a Nutrition Facts label, try to look up the calories online or ask your dietitian for help. Use smaller plates, glasses, and bowls for smaller portions and to prevent overeating. Use your calories on foods and drinks that will fill you up and not leave you hungry shortly after a meal. This information is not intended to replace advice given to you by your health care provider. Make sure you discuss any questions you have with your health care provider. Document Revised: 11/08/2019 Document Reviewed: 11/08/2019 Elsevier Patient Education  2023 Elsevier Inc.  

## 2022-07-09 LAB — CMP14+EGFR
ALT: 14 IU/L (ref 0–32)
AST: 17 IU/L (ref 0–40)
Albumin/Globulin Ratio: 1.5 (ref 1.2–2.2)
Albumin: 4.5 g/dL (ref 3.9–4.9)
Alkaline Phosphatase: 94 IU/L (ref 44–121)
BUN/Creatinine Ratio: 16 (ref 12–28)
BUN: 19 mg/dL (ref 8–27)
Bilirubin Total: 0.4 mg/dL (ref 0.0–1.2)
CO2: 20 mmol/L (ref 20–29)
Calcium: 9.5 mg/dL (ref 8.7–10.3)
Chloride: 105 mmol/L (ref 96–106)
Creatinine, Ser: 1.18 mg/dL — ABNORMAL HIGH (ref 0.57–1.00)
Globulin, Total: 3 g/dL (ref 1.5–4.5)
Glucose: 96 mg/dL (ref 70–99)
Potassium: 4.3 mmol/L (ref 3.5–5.2)
Sodium: 142 mmol/L (ref 134–144)
Total Protein: 7.5 g/dL (ref 6.0–8.5)
eGFR: 52 mL/min/{1.73_m2} — ABNORMAL LOW (ref >59)

## 2022-07-09 LAB — LIPID PANEL
Chol/HDL Ratio: 2.5 ratio (ref 0.0–4.4)
Cholesterol, Total: 146 mg/dL (ref 100–199)
HDL: 59 mg/dL (ref >39)
LDL Chol Calc (NIH): 76 mg/dL (ref 0–99)
Triglycerides: 53 mg/dL (ref 0–149)
VLDL Cholesterol Cal: 11 mg/dL (ref 5–40)

## 2022-07-09 LAB — HCV INTERPRETATION

## 2022-07-09 LAB — HCV AB W REFLEX TO QUANT PCR: HCV Ab: NONREACTIVE

## 2022-07-13 ENCOUNTER — Other Ambulatory Visit (INDEPENDENT_AMBULATORY_CARE_PROVIDER_SITE_OTHER): Payer: Self-pay | Admitting: Primary Care

## 2022-07-13 DIAGNOSIS — E7800 Pure hypercholesterolemia, unspecified: Secondary | ICD-10-CM

## 2022-07-13 MED ORDER — ATORVASTATIN CALCIUM 10 MG PO TABS: 10.0000 mg | ORAL_TABLET | Freq: Every day | ORAL | 3 refills | Status: DC

## 2022-07-14 ENCOUNTER — Telehealth (INDEPENDENT_AMBULATORY_CARE_PROVIDER_SITE_OTHER): Payer: Self-pay

## 2022-07-14 NOTE — Telephone Encounter (Signed)
Contacted pt to go over lab results pt is aware and doesn't have any questions or concerns 

## 2022-11-29 ENCOUNTER — Other Ambulatory Visit (INDEPENDENT_AMBULATORY_CARE_PROVIDER_SITE_OTHER): Payer: Self-pay | Admitting: Primary Care

## 2022-11-29 DIAGNOSIS — Z760 Encounter for issue of repeat prescription: Secondary | ICD-10-CM

## 2022-11-29 DIAGNOSIS — I10 Essential (primary) hypertension: Secondary | ICD-10-CM

## 2022-11-29 NOTE — Telephone Encounter (Signed)
Medication Refill - Medication: amLODipine (NORVASC) 10 MG tablet   Has the patient contacted their pharmacy? No.  Pt pharmacy switched since she has new insurance.   Preferred Pharmacy (with phone number or street name):  [id number] [place] [place]: [phone] Fax: [phone]    Has the patient been seen for an appointment in the last year OR does the patient have an upcoming appointment? Yes.    Agent: Please be advised that RX refills may take up to 3 business days. We ask that you follow-up with your pharmacy.

## 2022-11-30 NOTE — Telephone Encounter (Signed)
Unable to refill per protocol, Rx request is too soon. Last refill 07/05/22 for 90 and 1 refill.  Requested Prescriptions  Pending Prescriptions Disp Refills   amLODipine (NORVASC) 10 MG tablet 90 tablet 1     Cardiovascular: Calcium Channel Blockers 2 Passed - 11/29/2022 12:45 PM      Passed - Last BP in normal range    BP Readings from Last 1 Encounters:  07/08/22 129/76         Passed - Last Heart Rate in normal range    Pulse Readings from Last 1 Encounters:  07/08/22 85         Passed - Valid encounter within last 6 months    Recent Outpatient Visits           4 months ago Essential hypertension   [place], [place] P, NP   6 months ago Syncope, unspecified syncope type   [place] [facility] Kerin Perna, NP   12 months ago Screening for diabetes mellitus   [place] [facility] Kerin Perna, NP   1 year ago Essential hypertension   [place], Tonya S, NP   2 years ago Prediabetes   [place], [place], NP       Future Appointments             In 1 month Oletta Lamas, [place] Cage, NP [place]

## 2023-01-06 ENCOUNTER — Encounter (INDEPENDENT_AMBULATORY_CARE_PROVIDER_SITE_OTHER): Payer: Self-pay | Admitting: Primary Care

## 2023-01-06 ENCOUNTER — Ambulatory Visit (INDEPENDENT_AMBULATORY_CARE_PROVIDER_SITE_OTHER): Payer: 59 | Admitting: Primary Care

## 2023-01-06 VITALS — BP 146/75 | HR 73 | Resp 16 | Ht 67.0 in | Wt 196.6 lb

## 2023-01-06 DIAGNOSIS — Z6830 Body mass index (BMI) 30.0-30.9, adult: Secondary | ICD-10-CM | POA: Diagnosis not present

## 2023-01-06 DIAGNOSIS — E6609 Other obesity due to excess calories: Secondary | ICD-10-CM | POA: Diagnosis not present

## 2023-01-06 DIAGNOSIS — Z760 Encounter for issue of repeat prescription: Secondary | ICD-10-CM

## 2023-01-06 DIAGNOSIS — Z1211 Encounter for screening for malignant neoplasm of colon: Secondary | ICD-10-CM

## 2023-01-06 DIAGNOSIS — E7800 Pure hypercholesterolemia, unspecified: Secondary | ICD-10-CM | POA: Diagnosis not present

## 2023-01-06 DIAGNOSIS — Z1231 Encounter for screening mammogram for malignant neoplasm of breast: Secondary | ICD-10-CM

## 2023-01-06 DIAGNOSIS — I10 Essential (primary) hypertension: Secondary | ICD-10-CM

## 2023-01-06 MED ORDER — ATORVASTATIN CALCIUM 10 MG PO TABS: 10.0000 mg | ORAL_TABLET | Freq: Every day | ORAL | 3 refills | Status: DC

## 2023-01-06 MED ORDER — LOSARTAN POTASSIUM 100 MG PO TABS: 100.0000 mg | ORAL_TABLET | Freq: Every day | ORAL | 1 refills | Status: DC

## 2023-01-06 MED ORDER — HYDROCHLOROTHIAZIDE 25 MG PO TABS: 25.0000 mg | ORAL_TABLET | Freq: Every day | ORAL | 3 refills | Status: DC

## 2023-01-06 MED ORDER — AMLODIPINE BESYLATE 10 MG PO TABS: ORAL_TABLET | ORAL | 1 refills | Status: DC

## 2023-01-06 NOTE — Patient Instructions (Signed)
Calorie Counting for Weight Loss Calories are units of energy. Your body needs a certain number of calories from food to keep going throughout the day. When you eat or drink more calories than your body needs, your body stores the extra calories mostly as fat. When you eat or drink fewer calories than your body needs, your body burns fat to get the energy it needs. Calorie counting means keeping track of how many calories you eat and drink each day. Calorie counting can be helpful if you need to lose weight. If you eat fewer calories than your body needs, you should lose weight. Ask your health care provider what a healthy weight is for you. For calorie counting to work, you will need to eat the right number of calories each day to lose a healthy amount of weight per week. A dietitian can help you figure out how many calories you need in a day and will suggest ways to reach your calorie goal. A healthy amount of weight to lose each week is usually 1-2 lb (0.5-0.9 kg). This usually means that your daily calorie intake should be reduced by 500-750 calories. Eating 1,200-1,500 calories a day can help most women lose weight. Eating 1,500-1,800 calories a day can help most men lose weight. What do I need to know about calorie counting? Work with your health care provider or dietitian to determine how many calories you should get each day. To meet your daily calorie goal, you will need to: Find out how many calories are in each food that you would like to eat. Try to do this before you eat. Decide how much of the food you plan to eat. Keep a food log. Do this by writing down what you ate and how many calories it had. To successfully lose weight, it is important to balance calorie counting with a healthy lifestyle that includes regular activity. Where do I find calorie information?  The number of calories in a food can be found on a Nutrition Facts label. If a food does not have a Nutrition Facts label, try  to look up the calories online or ask your dietitian for help. Remember that calories are listed per serving. If you choose to have more than one serving of a food, you will have to multiply the calories per serving by the number of servings you plan to eat. For example, the label on a package of bread might say that a serving size is 1 slice and that there are 90 calories in a serving. If you eat 1 slice, you will have eaten 90 calories. If you eat 2 slices, you will have eaten 180 calories. How do I keep a food log? After each time that you eat, record the following in your food log as soon as possible: What you ate. Be sure to include toppings, sauces, and other extras on the food. How much you ate. This can be measured in cups, ounces, or number of items. How many calories were in each food and drink. The total number of calories in the food you ate. Keep your food log near you, such as in a pocket-sized notebook or on an app or website on your mobile phone. Some programs will calculate calories for you and show you how many calories you have left to meet your daily goal. What are some portion-control tips? Know how many calories are in a serving. This will help you know how many servings you can have of a certain   food. Use a measuring cup to measure serving sizes. You could also try weighing out portions on a kitchen scale. With time, you will be able to estimate serving sizes for some foods. Take time to put servings of different foods on your favorite plates or in your favorite bowls and cups so you know what a serving looks like. Try not to eat straight from a food's packaging, such as from a bag or box. Eating straight from the package makes it hard to see how much you are eating and can lead to overeating. Put the amount you would like to eat in a cup or on a plate to make sure you are eating the right portion. Use smaller plates, glasses, and bowls for smaller portions and to prevent  overeating. Try not to multitask. For example, avoid watching TV or using your computer while eating. If it is time to eat, sit down at a table and enjoy your food. This will help you recognize when you are full. It will also help you be more mindful of what and how much you are eating. What are tips for following this plan? Reading food labels Check the calorie count compared with the serving size. The serving size may be smaller than what you are used to eating. Check the source of the calories. Try to choose foods that are high in protein, fiber, and vitamins, and low in saturated fat, trans fat, and sodium. Shopping Read nutrition labels while you shop. This will help you make healthy decisions about which foods to buy. Pay attention to nutrition labels for low-fat or fat-free foods. These foods sometimes have the same number of calories or more calories than the full-fat versions. They also often have added sugar, starch, or salt to make up for flavor that was removed with the fat. Make a grocery list of lower-calorie foods and stick to it. Cooking Try to cook your favorite foods in a healthier way. For example, try baking instead of frying. Use low-fat dairy products. Meal planning Use more fruits and vegetables. One-half of your plate should be fruits and vegetables. Include lean proteins, such as chicken, turkey, and fish. Lifestyle Each week, aim to do one of the following: 150 minutes of moderate exercise, such as walking. 75 minutes of vigorous exercise, such as running. General information Know how many calories are in the foods you eat most often. This will help you calculate calorie counts faster. Find a way of tracking calories that works for you. Get creative. Try different apps or programs if writing down calories does not work for you. What foods should I eat?  Eat nutritious foods. It is better to have a nutritious, high-calorie food, such as an avocado, than a food with  few nutrients, such as a bag of potato chips. Use your calories on foods and drinks that will fill you up and will not leave you hungry soon after eating. Examples of foods that fill you up are nuts and nut butters, vegetables, lean proteins, and high-fiber foods such as whole grains. High-fiber foods are foods with more than 5 g of fiber per serving. Pay attention to calories in drinks. Low-calorie drinks include water and unsweetened drinks. The items listed above may not be a complete list of foods and beverages you can eat. Contact a dietitian for more information. What foods should I limit? Limit foods or drinks that are not good sources of vitamins, minerals, or protein or that are high in unhealthy fats. These   include: Candy. Other sweets. Sodas, specialty coffee drinks, alcohol, and juice. The items listed above may not be a complete list of foods and beverages you should avoid. Contact a dietitian for more information. How do I count calories when eating out? Pay attention to portions. Often, portions are much larger when eating out. Try these tips to keep portions smaller: Consider sharing a meal instead of getting your own. If you get your own meal, eat only half of it. Before you start eating, ask for a container and put half of your meal into it. When available, consider ordering smaller portions from the menu instead of full portions. Pay attention to your food and drink choices. Knowing the way food is cooked and what is included with the meal can help you eat fewer calories. If calories are listed on the menu, choose the lower-calorie options. Choose dishes that include vegetables, fruits, whole grains, low-fat dairy products, and lean proteins. Choose items that are boiled, broiled, grilled, or steamed. Avoid items that are buttered, battered, fried, or served with cream sauce. Items labeled as crispy are usually fried, unless stated otherwise. Choose water, low-fat milk,  unsweetened iced tea, or other drinks without added sugar. If you want an alcoholic beverage, choose a lower-calorie option, such as a glass of wine or light beer. Ask for dressings, sauces, and syrups on the side. These are usually high in calories, so you should limit the amount you eat. If you want a salad, choose a garden salad and ask for grilled meats. Avoid extra toppings such as bacon, cheese, or fried items. Ask for the dressing on the side, or ask for olive oil and vinegar or lemon to use as dressing. Estimate how many servings of a food you are given. Knowing serving sizes will help you be aware of how much food you are eating at restaurants. Where to find more information Centers for Disease Control and Prevention: www.cdc.gov U.[place]. Department of Agriculture: myplate.gov Summary Calorie counting means keeping track of how many calories you eat and drink each day. If you eat fewer calories than your body needs, you should lose weight. A healthy amount of weight to lose per week is usually 1-2 lb (0.5-0.9 kg). This usually means reducing your daily calorie intake by 500-750 calories. The number of calories in a food can be found on a Nutrition Facts label. If a food does not have a Nutrition Facts label, try to look up the calories online or ask your dietitian for help. Use smaller plates, glasses, and bowls for smaller portions and to prevent overeating. Use your calories on foods and drinks that will fill you up and not leave you hungry shortly after a meal. This information is not intended to replace advice given to you by your health care provider. Make sure you discuss any questions you have with your health care provider. Document Revised: 11/08/2019 Document Reviewed: 11/08/2019 Elsevier Patient Education  2023 Elsevier Inc.  

## 2023-01-06 NOTE — Progress Notes (Signed)
[place]  Rebecca Huber, is a 62 y.o. female  [id number]  [id number]  DOB - 08/08/1961  No chief complaint on file.      Subjective:   Rebecca Huber is a 62 y.o. female here today for a follow up visit. Patient has No headache, No chest pain, No abdominal pain - No Nausea, No new weakness tingling or numbness, No Cough - shortness of breath  No problems updated.  No Known Allergies  No past medical history on file.  No current outpatient medications on file prior to visit.   No current facility-administered medications on file prior to visit.    Objective:  Blood Pressure (Abnormal) 146/75 (BP Location: Right Arm, Patient Position: Sitting, Cuff Size: Large)   Pulse 73   Respiration 16   Height 5\' 7"  (1.702 m)   Weight 196 lb 9.6 oz (89.2 kg)   Oxygen Saturation 98%   Body Mass Index 30.79 kg/m    Comprehensive ROS Pertinent positive and negative noted in HPI   Exam General appearance : Awake, alert, not in any distress. Speech Clear. Not toxic looking HEENT: Atraumatic and Normocephalic, pupils equally reactive to light and accomodation Neck: Supple, no JVD. No cervical lymphadenopathy.  Chest: Good air entry bilaterally, no added sounds  CVS: S1 S2 regular, no murmurs.  Abdomen: Bowel sounds present, Non tender and not distended with no gaurding, rigidity or rebound. Extremities: B/L Lower Ext shows no edema, both legs are warm to touch Neurology: Awake alert, and oriented X 3, CN II-XII intact, Non focal Skin: No Rash  Data Review Lab Results  Component Value Date   HGBA1C 5.8 (A) 12/01/2021   HGBA1C 5.2 11/27/2020   HGBA1C 6.0 (H) 09/06/2019    Assessment & Plan   Diagnoses and all orders for this visit:  Colon cancer screening -     Fecal occult blood, imunochemical; Future  Encounter for screening mammogram for malignant neoplasm of breast -     MM Digital Screening; Future  Medication refill -     amLODipine (NORVASC)  10 MG tablet; Take one tablet daily by mouth -     hydrochlorothiazide (HYDRODIURIL) 25 MG tablet; Take 1 tablet (25 mg total) by mouth daily. -     losartan (COZAAR) 100 MG tablet; Take 1 tablet (100 mg total) by mouth daily.  Essential hypertension BP goal - < 140/90 Explained that having normal blood pressure is the goal and medications are helping to get to goal and maintain normal blood pressure. DIET: Limit salt intake, read nutrition labels to check salt content, limit fried and high fatty foods  Avoid using multisymptom OTC cold preparations that generally contain sudafed which can rise BP. Consult with pharmacist on best cold relief products to use for persons with HTN EXERCISE Discussed incorporating exercise such as walking - 30 minutes most days of the week and can do in 10 minute intervals    -     amLODipine (NORVASC) 10 MG tablet; Take one tablet daily by mouth -     hydrochlorothiazide (HYDRODIURIL) 25 MG tablet; Take 1 tablet (25 mg total) by mouth daily. -     losartan (COZAAR) 100 MG tablet; Take 1 tablet (100 mg total) by mouth daily.  Elevated LDL cholesterol level CVD protection  -     atorvastatin (LIPITOR) 10 MG tablet; Take 1 tablet (10 mg total) by mouth daily.   Class 1 obesity due to excess calories without serious comorbidity with body mass index (  BMI) of 30.0 to 30.9 in adult  Obesity is 30-39 indicating an excess in caloric intake or underlining conditions. This may lead to other co-morbidities. Educated on lifestyle modifications of diet and exercise which may reduce obesity.     Patient have been counseled extensively about nutrition and exercise. Other issues discussed during this visit include: low cholesterol diet, weight control and daily exercise, foot care, annual eye examinations at Ophthalmology, importance of adherence with medications and regular follow-up. We also discussed long term complications of uncontrolled diabetes and hypertension.    Return in about 6 months (around 07/09/2023) for HTN/CHol fasting.  The patient was given clear instructions to go to ER or return to medical center if symptoms don't improve, worsen or new problems develop. The patient verbalized understanding. The patient was told to call to get lab results if they haven't heard anything in the next week.   This note has been created with Surveyor, quantity. Any transcriptional errors are unintentional.   Kerin Perna, NP 01/06/2023, 4:00 PM

## 2023-01-24 ENCOUNTER — Telehealth: Payer: Self-pay | Admitting: Primary Care

## 2023-01-24 NOTE — Telephone Encounter (Signed)
Copied from CRM [id number]. Topic: General - Other >> Jan 24, 2023  9:07 AM Dondra Prader A wrote: Reason for CRM: [place] from [employer] is calling to get the insurance the pt had in September 2023. Per [place] she see that the pt had [employer] at one point in time as well as she had Jari Favre. Please call [place] back.

## 2023-03-21 ENCOUNTER — Ambulatory Visit (INDEPENDENT_AMBULATORY_CARE_PROVIDER_SITE_OTHER): Payer: 59 | Admitting: Primary Care

## 2023-03-21 ENCOUNTER — Ambulatory Visit (INDEPENDENT_AMBULATORY_CARE_PROVIDER_SITE_OTHER): Payer: Self-pay | Admitting: *Deleted

## 2023-03-21 ENCOUNTER — Encounter (INDEPENDENT_AMBULATORY_CARE_PROVIDER_SITE_OTHER): Payer: Self-pay | Admitting: Primary Care

## 2023-03-21 VITALS — BP 119/71 | HR 83 | Resp 16 | Wt 191.0 lb

## 2023-03-21 DIAGNOSIS — R55 Syncope and collapse: Secondary | ICD-10-CM

## 2023-03-21 NOTE — Telephone Encounter (Signed)
Patient given appointment for today with provider. Patent is aware.

## 2023-03-21 NOTE — Telephone Encounter (Signed)
Patient fainted Saturday night Reason for Disposition  [1] Age > 50 years  AND [2] now alert and feels fine    Patient has had several fainting episodes- fine now- will schedule.  Answer Assessment - Initial Assessment Questions 1. ONSET: "How long were you unconscious?" (minutes) "When did it happen?"     Patient fainted Saturday night- 10 seconds 2. CONTENT: "What happened during period of unconsciousness?" (e.g., seizure activity)      No- patient was alone- hit her lip- Ambulance can and checked her 3. MENTAL STATUS: "Alert and oriented now?" (oriented x 3 = name, month, location)      oriented 4. TRIGGER: "What do you think caused the fainting?" "What were you doing just before you fainted?"  (e.g., exercise, sudden standing up, prolonged standing)     Every time she is with family- hot, drinking beer(6 beers)  5. RECURRENT SYMPTOM: "Have you ever passed out before?" If Yes, ask: "When was the last time?" and "What happened that time?"      3 times- every time drinking beer- over 3 year period 6. INJURY: "Did you sustain any injury during the fall?"      Hit lip- swollen 7. CARDIAC SYMPTOMS: "Have you had any of the following symptoms: chest pain, difficulty breathing, palpitations?"     no 8. NEUROLOGIC SYMPTOMS: "Have you had any of the following symptoms: headache, numbness, vertigo, weakness?"     no 9. GI SYMPTOMS: "Have you had any of the following symptoms: abdomen pain, vomiting, diarrhea, blood in stools?"     Blood in stool- chronic 10. OTHER SYMPTOMS: "Do you have any other symptoms?"       no  Protocols used: Fainting-A-AH

## 2023-03-21 NOTE — Telephone Encounter (Signed)
Pt is scheduled for an appt with Marcelino Duster for 6/10 at 1030am

## 2023-03-21 NOTE — Telephone Encounter (Signed)
  Chief Complaint: fainted- Saturday Symptoms: patient fainted Saturday- unconscious for about 10 seconds- hit her lip- EMS came and checked her Frequency: 3 times within several months Pertinent Negatives: Patient denies abdomen pain, vomiting, diarrhea  Disposition: [] ED /[] Urgent Care (no appt availability in office) / [] Appointment(In office/virtual)/ []  [facility]/ [] Home Care/ [] Refused Recommended Disposition /[] [facility]/ [x]  Follow-up with PCP Additional Notes: Patient was advised to schedule with provider if it happens again- no open appointment this week. Note sent so patient can be scheduled.

## 2023-03-21 NOTE — Progress Notes (Signed)
   Acute Office Visit  Subjective:     Patient ID: Rebecca Huber, female    DOB: January 04, 1961, 62 y.o.   MRN: [id number]  Chief Complaint  Patient presents with   Loss of Consciousness    Started Saturday    Loss of Consciousness Ms. Rosina Lowenstein. Huber is a 62 year old female. She having syncope episodes when she gets over exhausted or having a bowel movement . Patient has No headache, No chest pain, No abdominal pain - No Nausea, No new weakness tingling or numbness, No Cough - shortness of breath   Review of Systems  Cardiovascular:  Positive for syncope.  Comprehensive ROS Pertinent positive and negative noted in HPI       Objective:   Blood Pressure 119/71   Pulse 83   Respiration 16   Weight 191 lb (86.6 kg)   Oxygen Saturation 99%   Body Mass Index 29.91 kg/m  Physical Exam Constitutional:      Appearance: She is obese.  HENT:     Head: Normocephalic.     Right Ear: Tympanic membrane and external ear normal.     Left Ear: Tympanic membrane and external ear normal.     Nose: Nose normal.  Eyes:     Extraocular Movements: Extraocular movements intact.  Cardiovascular:     Rate and Rhythm: Normal rate and regular rhythm.  Pulmonary:     Effort: Pulmonary effort is normal.     Breath sounds: Normal breath sounds.  Abdominal:     General: Bowel sounds are normal. There is distension.     Palpations: Abdomen is soft.  Musculoskeletal:        General: Normal range of motion.     Cervical back: Normal range of motion and neck supple.  Skin:    General: Skin is warm and dry.  Neurological:     Mental Status: She is alert and oriented to person, place, and time.  Psychiatric:        Mood and Affect: Mood normal.        Behavior: Behavior normal.   No results found for any visits on 03/21/23.    Assessment & Plan:  Rebecca Huber was seen today for loss of consciousness.  Diagnoses and all orders for this visit:  Syncope, unspecified syncope type Vasovagal Syncope/ heat  exhaustion are primary and secondary cause of syncope  Information provided on AVS    Return for pap.  Rebecca Sessions, NP

## 2023-03-21 NOTE — Patient Instructions (Addendum)
To prevent this from happening, try to keep yourself relaxed. Sit on the toilet with your head down and your legs crossed. This may help to keep your blood pressure steady.     Syncope is when you pass out or faint for a short time. It is caused by a sudden decrease in blood flow to the brain. This can happen for many reasons. It can sometimes happen when seeing blood, getting a shot (injection), or having pain or strong emotions. Most causes of fainting are not dangerous, but in some cases it can be a sign of a serious medical problem. If you faint, get help right away. Call your local emergency services (911 in the [place] Follow these instructions at home: Watch for any changes in your symptoms. Take these actions to stay safe and help with your symptoms: Knowing when you may be about to faint Signs that you may be about to faint include: Feeling dizzy or light-headed. It may feel like the room is spinning. Feeling weak. Feeling like you may vomit (nauseous). Seeing spots or seeing all white or all black. Having cold, clammy skin. Feeling warm and sweaty. Hearing ringing in the ears. If you start to feel like you might faint, sit or lie down right away. If sitting, lower your head down between your legs. If lying down, raise (elevate) your feet above the level of your heart. Breathe deeply and steadily. Wait until all of the symptoms are gone. Have someone stay with you until you feel better. Medicines Take over-the-counter and prescription medicines only as told by your doctor. If you are taking blood pressure or heart medicine, sit up and stand up slowly. Spend a few minutes getting ready to sit and then stand. This can help you feel less dizzy. Lifestyle Do not drive, use machinery, or play sports until your doctor says it is okay. Do not drink alcohol. Do not smoke or use any products that contain nicotine or tobacco. If you need help quitting, ask your doctor. Avoid hot tubs and  saunas. General instructions Talk with your doctor about your symptoms. You may need to have testing to help find the cause. Drink enough fluid to keep your pee (urine) pale yellow. Avoid standing for a long time. If you must stand for a long time, do movements such as: Moving your legs. Crossing your legs. Flexing and stretching your leg muscles. Squatting. Keep all follow-up visits. Contact a doctor if: You have episodes of near fainting. Get help right away if: You pass out or faint. You hit your head or are injured after fainting. You have any of these symptoms: Fast or uneven heartbeats (palpitations). Pain in your chest, belly, or back. Shortness of breath. You have jerky movements that you cannot control (seizure). You have a very bad headache. You are confused. You have problems with how you see (vision). You are very weak. You have trouble walking. You are bleeding from your mouth or your butt (rectum). You have black or tarry poop (stool). These symptoms may be an emergency. Get help right away. Call your local emergency services (911 in the [place] Do not wait to see if the symptoms will go away. Do not drive yourself to the hospital. Summary Syncope is when you pass out or faint for a short time. It is caused by a sudden decrease in blood flow to the brain. Signs that you may be about to faint include feeling dizzy or light-headed, feeling like you may vomit, seeing all  white or all black, or having cold, clammy skin. If you start to feel like you might faint, sit or lie down right away. Lower your head if sitting, or raise (elevate) your feet if lying down. Breathe deeply and steadily. Wait until all of the symptoms are gone. This information is not intended to replace advice given to you by your health care provider. Make sure you discuss any questions you have with your health care provider. Document Revised: 02/05/2021 Document Reviewed: 02/05/2021 Elsevier Patient  Education  2024 Elsevier Inc. -reflex-[id number]... About featured snippets  Feedback People also ask What causes vasovagal syncope during bowel movement?

## 2023-07-03 ENCOUNTER — Other Ambulatory Visit (INDEPENDENT_AMBULATORY_CARE_PROVIDER_SITE_OTHER): Payer: Self-pay | Admitting: Primary Care

## 2023-07-03 DIAGNOSIS — I10 Essential (primary) hypertension: Secondary | ICD-10-CM

## 2023-07-03 DIAGNOSIS — Z760 Encounter for issue of repeat prescription: Secondary | ICD-10-CM

## 2023-07-05 NOTE — Telephone Encounter (Signed)
Requested Prescriptions  Pending Prescriptions Disp Refills   losartan (COZAAR) 100 MG tablet [Pharmacy Med Name: LOSARTAN POTASSIUM 100 MG TAB] 90 tablet 0    Sig: TAKE 1 TABLET BY MOUTH EVERY DAY     Cardiovascular:  Angiotensin Receptor Blockers Failed - 07/03/2023  9:07 AM      Failed - Cr in normal range and within 180 days    Creatinine, Ser  Date Value Ref Range Status  07/08/2022 1.18 (H) 0.57 - 1.00 mg/dL Final   Creatinine, Urine  Date Value Ref Range Status  09/06/2019 190.92 mg/dL Final         Failed - K in normal range and within 180 days    Potassium  Date Value Ref Range Status  07/08/2022 4.3 3.5 - 5.2 mmol/L Final         Passed - Patient is not pregnant      Passed - Last BP in normal range    BP Readings from Last 1 Encounters:  03/21/23 119/71         Passed - Valid encounter within last 6 months    Recent Outpatient Visits           3 months ago Syncope, unspecified syncope type   [place] [facility] Grayce Sessions, NP   6 months ago Colon cancer screening   [place] [facility] Grayce Sessions, NP   12 months ago Essential hypertension   [place] [facility] Grayce Sessions, NP   1 year ago Syncope, unspecified syncope type   [place] [facility] Grayce Sessions, NP   1 year ago Screening for diabetes mellitus   [place] [facility] Grayce Sessions, NP       Future Appointments             In 2 days Randa Evens, Kinnie Scales, NP [place] [facility]

## 2023-07-07 ENCOUNTER — Ambulatory Visit (INDEPENDENT_AMBULATORY_CARE_PROVIDER_SITE_OTHER): Payer: PRIVATE HEALTH INSURANCE | Admitting: Primary Care

## 2023-07-07 ENCOUNTER — Encounter (INDEPENDENT_AMBULATORY_CARE_PROVIDER_SITE_OTHER): Payer: Self-pay | Admitting: Primary Care

## 2023-07-07 ENCOUNTER — Ambulatory Visit (INDEPENDENT_AMBULATORY_CARE_PROVIDER_SITE_OTHER): Payer: 59 | Admitting: Primary Care

## 2023-07-07 VITALS — BP 130/77 | HR 69 | Resp 16 | Ht 67.0 in | Wt 197.4 lb

## 2023-07-07 DIAGNOSIS — R7303 Prediabetes: Secondary | ICD-10-CM | POA: Diagnosis not present

## 2023-07-07 DIAGNOSIS — Z1211 Encounter for screening for malignant neoplasm of colon: Secondary | ICD-10-CM

## 2023-07-07 DIAGNOSIS — Z760 Encounter for issue of repeat prescription: Secondary | ICD-10-CM | POA: Diagnosis not present

## 2023-07-07 DIAGNOSIS — E7800 Pure hypercholesterolemia, unspecified: Secondary | ICD-10-CM | POA: Diagnosis not present

## 2023-07-07 DIAGNOSIS — I10 Essential (primary) hypertension: Secondary | ICD-10-CM

## 2023-07-07 DIAGNOSIS — Z1231 Encounter for screening mammogram for malignant neoplasm of breast: Secondary | ICD-10-CM

## 2023-07-07 MED ORDER — AMLODIPINE BESYLATE 10 MG PO TABS
ORAL_TABLET | ORAL | 1 refills | Status: DC
Start: 2023-07-07 — End: 2024-01-11

## 2023-07-07 MED ORDER — LOSARTAN POTASSIUM 100 MG PO TABS
100.0000 mg | ORAL_TABLET | Freq: Every day | ORAL | 1 refills | Status: DC
Start: 2023-07-07 — End: 2024-01-11

## 2023-07-07 MED ORDER — HYDROCHLOROTHIAZIDE 25 MG PO TABS
25.0000 mg | ORAL_TABLET | Freq: Every day | ORAL | 3 refills | Status: DC
Start: 2023-07-07 — End: 2024-01-11

## 2023-07-07 NOTE — Patient Instructions (Addendum)
Please contact [facility] to schedule Mammogram   [facility] Address: [address], [place], [place] [address] Phone: [phone]

## 2023-07-07 NOTE — Progress Notes (Signed)
[facility]  [name], is a 62 y.o. female  UXL:[id number]  ZDG:[id number]  DOB - 28-Mar-1961  Chief Complaint  Patient presents with   Hypertension   Prediabetes       Subjective:   Rebecca Huber is a 62 y.o. female here today for a follow up visit for management of HTN. Patient has No headache, No chest pain, No abdominal pain - No Nausea, No new weakness tingling or numbness, No Cough - shortness of breath Also, Preidabetes-  Denies polyuria, polydipsia, polyphasia or vision changes.  Does not check blood sugars at home.  No problems updated.  No Known Allergies  No past medical history on file.  Current Outpatient Medications on File Prior to Visit  Medication Sig Dispense Refill   atorvastatin (LIPITOR) 10 MG tablet Take 1 tablet (10 mg total) by mouth daily. 90 tablet 3   No current facility-administered medications on file prior to visit.    Objective:   Vitals:   07/07/23 1024  BP: 130/77  Pulse: 69  Resp: 16  SpO2: 97%  Weight: 197 lb 6.4 oz (89.5 kg)  Height: 5\' 7"  (1.702 m)    Comprehensive ROS Pertinent positive and negative noted in HPI   Exam General appearance : Awake, alert, not in any distress. Speech Clear. Not toxic looking HEENT: Atraumatic and Normocephalic, pupils equally reactive to light and accomodation Neck: Supple, no JVD. No cervical lymphadenopathy.  Chest: Good air entry bilaterally, no added sounds  CVS: S1 S2 regular, no murmurs.  Abdomen: Bowel sounds present, Non tender and not distended with no gaurding, rigidity or rebound. Extremities: B/L Lower Ext shows no edema, both legs are warm to touch Neurology: Awake alert, and oriented X 3, CN II-XII intact, Non focal Skin: No Rash  Data Review Lab Results  Component Value Date   HGBA1C 5.8 (A) 12/01/2021   HGBA1C 5.2 11/27/2020   HGBA1C 6.0 (H) 09/06/2019    Assessment & Plan  Rebecca Huber was seen today for hypertension and prediabetes.  Diagnoses and all  orders for this visit:  Elevated LDL cholesterol level On statin -     Lipid panel  Essential hypertension Controlled Take all medication as prescribed. Avoid smoked meats which are high in sodium content. Avoid soda which contains sodium and are high in sugar which increases your risk for diabetes.  -     CMP14+EGFR -     amLODipine (NORVASC) 10 MG tablet; Take one tablet daily by mouth -     hydrochlorothiazide (HYDRODIURIL) 25 MG tablet; Take 1 tablet (25 mg total) by mouth daily. -     losartan (COZAAR) 100 MG tablet; Take 1 tablet (100 mg total) by mouth daily.  Prediabetes -     Hemoglobin A1c  Medication refill -     amLODipine (NORVASC) 10 MG tablet; Take one tablet daily by mouth -     hydrochlorothiazide (HYDRODIURIL) 25 MG tablet; Take 1 tablet (25 mg total) by mouth daily. -     losartan (COZAAR) 100 MG tablet; Take 1 tablet (100 mg total) by mouth daily.  Colon cancer screening -     Cologuard  Encounter for screening mammogram for malignant neoplasm of breast Mammogram screening ordered      Patient have been counseled extensively about nutrition and exercise. Other issues discussed during this visit include: low cholesterol diet, weight control and daily exercise, foot care, annual eye examinations at Ophthalmology, importance of adherence with medications and regular follow-up. We also discussed  long term complications of uncontrolled diabetes and hypertension.   No follow-ups on file.  The patient was given clear instructions to go to ER or return to medical center if symptoms don't improve, worsen or new problems develop. The patient verbalized understanding. The patient was told to call to get lab results if they haven't heard anything in the next week.   This note has been created with Education officer, environmental. Any transcriptional errors are unintentional.   Grayce Sessions, NP 07/07/2023, 12:00 PM

## 2023-07-08 LAB — LIPID PANEL
Chol/HDL Ratio: 2.5 {ratio} (ref 0.0–4.4)
Cholesterol, Total: 149 mg/dL (ref 100–199)
HDL: 59 mg/dL (ref >39)
LDL Chol Calc (NIH): 81 mg/dL (ref 0–99)
Triglycerides: 37 mg/dL (ref 0–149)
VLDL Cholesterol Cal: 9 mg/dL (ref 5–40)

## 2023-07-08 LAB — CMP14+EGFR
ALT: 7 [IU]/L (ref 0–32)
AST: 15 [IU]/L (ref 0–40)
Albumin: 4.1 g/dL (ref 3.9–4.9)
Alkaline Phosphatase: 96 [IU]/L (ref 44–121)
BUN/Creatinine Ratio: 19 (ref 12–28)
BUN: 25 mg/dL (ref 8–27)
Bilirubin Total: 0.3 mg/dL (ref 0.0–1.2)
CO2: 21 mmol/L (ref 20–29)
Calcium: 8.8 mg/dL (ref 8.7–10.3)
Chloride: 105 mmol/L (ref 96–106)
Creatinine, Ser: 1.32 mg/dL — ABNORMAL HIGH (ref 0.57–1.00)
Globulin, Total: 2.8 g/dL (ref 1.5–4.5)
Glucose: 98 mg/dL (ref 70–99)
Potassium: 3.9 mmol/L (ref 3.5–5.2)
Sodium: 141 mmol/L (ref 134–144)
Total Protein: 6.9 g/dL (ref 6.0–8.5)
eGFR: 45 mL/min/{1.73_m2} — ABNORMAL LOW (ref >59)

## 2023-07-08 LAB — HEMOGLOBIN A1C
Est. average glucose Bld gHb Est-mCnc: 134 mg/dL
Hgb A1c MFr Bld: 6.3 % — ABNORMAL HIGH (ref 4.8–5.6)

## 2023-07-22 ENCOUNTER — Ambulatory Visit
Admission: RE | Admit: 2023-07-22 | Discharge: 2023-07-22 | Disposition: A | Discharge status: Home / Self Care | Payer: 59 | Source: Ambulatory Visit | Attending: Primary Care | Admitting: Primary Care

## 2023-07-22 DIAGNOSIS — Z1231 Encounter for screening mammogram for malignant neoplasm of breast: Secondary | ICD-10-CM

## 2024-01-03 ENCOUNTER — Telehealth (INDEPENDENT_AMBULATORY_CARE_PROVIDER_SITE_OTHER): Payer: Self-pay | Admitting: Primary Care

## 2024-01-03 NOTE — Telephone Encounter (Signed)
 Spoke to pt about appt.. Will be present

## 2024-01-11 ENCOUNTER — Encounter (INDEPENDENT_AMBULATORY_CARE_PROVIDER_SITE_OTHER): Payer: Self-pay | Admitting: Primary Care

## 2024-01-11 ENCOUNTER — Ambulatory Visit (INDEPENDENT_AMBULATORY_CARE_PROVIDER_SITE_OTHER): Admitting: Primary Care

## 2024-01-11 VITALS — BP 127/78 | HR 85 | Ht 67.0 in | Wt 198.6 lb

## 2024-01-11 DIAGNOSIS — Z1211 Encounter for screening for malignant neoplasm of colon: Secondary | ICD-10-CM

## 2024-01-11 DIAGNOSIS — Z23 Encounter for immunization: Secondary | ICD-10-CM

## 2024-01-11 DIAGNOSIS — Z1231 Encounter for screening mammogram for malignant neoplasm of breast: Secondary | ICD-10-CM

## 2024-01-11 DIAGNOSIS — R7303 Prediabetes: Secondary | ICD-10-CM | POA: Diagnosis not present

## 2024-01-11 DIAGNOSIS — I10 Essential (primary) hypertension: Secondary | ICD-10-CM

## 2024-01-11 DIAGNOSIS — E7800 Pure hypercholesterolemia, unspecified: Secondary | ICD-10-CM

## 2024-01-11 DIAGNOSIS — Z0000 Encounter for general adult medical examination without abnormal findings: Secondary | ICD-10-CM

## 2024-01-11 DIAGNOSIS — Z760 Encounter for issue of repeat prescription: Secondary | ICD-10-CM

## 2024-01-11 LAB — POCT GLYCOSYLATED HEMOGLOBIN (HGB A1C): HbA1c, POC (controlled diabetic range): 5.9 % (ref 0.0–7.0)

## 2024-01-11 MED ORDER — AMLODIPINE BESYLATE 10 MG PO TABS: ORAL_TABLET | ORAL | 1 refills | Status: DC

## 2024-01-11 MED ORDER — LOSARTAN POTASSIUM 100 MG PO TABS: 100.0000 mg | ORAL_TABLET | Freq: Every day | ORAL | 1 refills | Status: DC

## 2024-01-11 MED ORDER — HYDROCHLOROTHIAZIDE 25 MG PO TABS: 25.0000 mg | ORAL_TABLET | Freq: Every day | ORAL | 3 refills | Status: DC

## 2024-01-11 MED ORDER — ATORVASTATIN CALCIUM 10 MG PO TABS: 10.0000 mg | ORAL_TABLET | Freq: Every day | ORAL | 3 refills | Status: DC

## 2024-01-11 NOTE — Progress Notes (Unsigned)
 [facility]  Rebecca Huber is a 63 y.o. female presents to office today for annual physical exam examination.    Concerns today include: 1. refills  Occupation: manage housekeeping, Marital status: S, Substance use: N Diet: Regular, Exercise: walking  Health Maintenance  Topic Date Due   Colonoscopy  Never done   COLON CANCER SCREENING ANNUAL FOBT  12/19/2020   MAMMOGRAM  01/07/2022   Cervical Cancer Screening (HPV/Pap Cotest)  12/19/2022   COVID-19 Vaccine (3 - 2024-25 season) 06/12/2023   Zoster Vaccines- Shingrix (1 of 2) 04/11/2024 (Originally 02/20/2009)   INFLUENZA VACCINE  05/11/2024   DTaP/Tdap/Td (2 - Td or Tdap) 11/22/2029   Hepatitis C Screening  Completed   HIV Screening  Completed   HPV VACCINES  Aged Out     No past medical history on file. Social History   Socioeconomic History   Marital status: Married    Spouse name: Not on file   Number of children: Not on file   Years of education: Not on file   Highest education level: Not on file  Occupational History   Not on file  Tobacco Use   Smoking status: Never   Smokeless tobacco: Never  Substance and Sexual Activity   Alcohol use: Yes   Drug use: Never   Sexual activity: Not Currently  Other Topics Concern   Not on file  Social History Narrative   Not on file   Social Drivers of Health   Financial Resource Strain: Not on file  Food Insecurity: No Food Insecurity (07/07/2023)   Hunger Vital Sign    Worried About Running Out of Food in the Last Year: Never true    Ran Out of Food in the Last Year: Never true  Transportation Needs: No Transportation Needs (07/07/2023)   PRAPARE - Administrator, Civil Service (Medical): No    Lack of Transportation (Non-Medical): No  Physical Activity: Sufficiently Active (07/07/2023)   Exercise Vital Sign    Days of Exercise per Week: 5 days    Minutes of Exercise per Session: 150+ min  Stress: Not on file  Social Connections: Not on  file  Intimate Partner Violence: Not At Risk (07/07/2023)   Humiliation, Afraid, Rape, and Kick questionnaire    Fear of Current or Ex-Partner: No    Emotionally Abused: No    Physically Abused: No    Sexually Abused: No   No past surgical history on file. No family history on file.  Current Outpatient Medications:    amLODipine (NORVASC) 10 MG tablet, Take one tablet daily by mouth, Disp: 90 tablet, Rfl: 1   atorvastatin (LIPITOR) 10 MG tablet, Take 1 tablet (10 mg total) by mouth daily., Disp: 90 tablet, Rfl: 3   hydrochlorothiazide (HYDRODIURIL) 25 MG tablet, Take 1 tablet (25 mg total) by mouth daily., Disp: 90 tablet, Rfl: 3   losartan (COZAAR) 100 MG tablet, Take 1 tablet (100 mg total) by mouth daily., Disp: 90 tablet, Rfl: 1 Outpatient Encounter Medications as of 01/11/2024  Medication Sig   amLODipine (NORVASC) 10 MG tablet Take one tablet daily by mouth   atorvastatin (LIPITOR) 10 MG tablet Take 1 tablet (10 mg total) by mouth daily.   hydrochlorothiazide (HYDRODIURIL) 25 MG tablet Take 1 tablet (25 mg total) by mouth daily.   losartan (COZAAR) 100 MG tablet Take 1 tablet (100 mg total) by mouth daily.   [DISCONTINUED] amLODipine (NORVASC) 10 MG tablet Take one tablet daily by mouth   [  DISCONTINUED] atorvastatin (LIPITOR) 10 MG tablet Take 1 tablet (10 mg total) by mouth daily.   [DISCONTINUED] hydrochlorothiazide (HYDRODIURIL) 25 MG tablet Take 1 tablet (25 mg total) by mouth daily.   [DISCONTINUED] losartan (COZAAR) 100 MG tablet Take 1 tablet (100 mg total) by mouth daily.   No facility-administered encounter medications on file as of 01/11/2024.    No Known Allergies   ROS: Review of Systems Pertinent items noted in HPI and remainder of comprehensive ROS otherwise negative.    Physical exam Physical exam: General: Vital signs reviewed.  Patient is well-developed and well-nourished, obese in no acute distress and cooperative with exam. Head: Normocephalic and  atraumatic. Eyes: EOMI, conjunctivae normal, no scleral icterus. Neck: Supple, trachea midline, normal ROM, no JVD, masses, thyromegaly, or carotid bruit present. Cardiovascular: RRR, S1 normal, S2 normal, no murmurs, gallops, or rubs. Pulmonary/Chest: Clear to auscultation bilaterally, no wheezes, rales, or rhonchi. Abdominal: Soft, non-tender, non-distended, BS +, no masses, organomegaly, or guarding present. Musculoskeletal: No joint deformities, erythema, or stiffness, ROM full and nontender. Extremities: No lower extremity edema bilaterally,  pulses symmetric and intact bilaterally. No cyanosis or clubbing. Neurological: A&O x3, Strength is normal Skin: Warm, dry and intact. No rashes or erythema. Psychiatric: Normal mood and affect. speech and behavior is normal. Cognition and memory are normal.      Assessment/ Plan: Rebecca Huber here for annual physical exam.  Rebecca Huber was seen today for annual exam.  Diagnoses and all orders for this visit:  Screening mammogram for breast cancer -     MM DIGITAL SCREENING BILATERAL; Future  Prediabetes -     HgB A1c  Special screening for malignant neoplasms, colon -     Ambulatory referral to Gastroenterology  Essential hypertension Well controlled less than 140/90 -     losartan (COZAAR) 100 MG tablet; Take 1 tablet (100 mg total) by mouth daily. -     hydrochlorothiazide (HYDRODIURIL) 25 MG tablet; Take 1 tablet (25 mg total) by mouth daily. -     amLODipine (NORVASC) 10 MG tablet; Take one tablet daily by mouth  Medication refill -     losartan (COZAAR) 100 MG tablet; Take 1 tablet (100 mg total) by mouth daily. -     hydrochlorothiazide (HYDRODIURIL) 25 MG tablet; Take 1 tablet (25 mg total) by mouth daily. -     amLODipine (NORVASC) 10 MG tablet; Take one tablet daily by mouth -     atorvastatin (LIPITOR) 10 MG tablet; Take 1 tablet (10 mg total) by mouth daily.  Elevated LDL cholesterol level -     atorvastatin (LIPITOR) 10 MG  tablet; Take 1 tablet (10 mg total) by mouth daily.     Counseled on healthy lifestyle choices, including diet (rich in fruits, vegetables and lean meats and low in salt and simple carbohydrates) and exercise (at least 30 minutes of moderate physical activity daily).  Patient to follow up in 1 year for annual exam or sooner if needed.  The above assessment and management plan was discussed with the patient. The patient verbalized understanding of and has agreed to the management plan. Patient is aware to call the clinic if symptoms persist or worsen. Patient is aware when to return to the clinic for a follow-up visit. Patient educated on when it is appropriate to go to the emergency department.   This note has been created with Education officer, environmental. Any transcriptional errors are unintentional.   Grayce Sessions,  NP 01/11/2024, 9:40 AM

## 2024-01-13 ENCOUNTER — Other Ambulatory Visit (INDEPENDENT_AMBULATORY_CARE_PROVIDER_SITE_OTHER): Payer: Self-pay | Admitting: Primary Care

## 2024-01-13 DIAGNOSIS — I10 Essential (primary) hypertension: Secondary | ICD-10-CM

## 2024-01-13 DIAGNOSIS — R7303 Prediabetes: Secondary | ICD-10-CM

## 2024-01-13 DIAGNOSIS — N6310 Unspecified lump in the right breast, unspecified quadrant: Secondary | ICD-10-CM

## 2024-01-13 DIAGNOSIS — Z1211 Encounter for screening for malignant neoplasm of colon: Secondary | ICD-10-CM

## 2024-01-13 DIAGNOSIS — E7800 Pure hypercholesterolemia, unspecified: Secondary | ICD-10-CM

## 2024-01-13 DIAGNOSIS — Z23 Encounter for immunization: Secondary | ICD-10-CM

## 2024-01-13 DIAGNOSIS — Z760 Encounter for issue of repeat prescription: Secondary | ICD-10-CM

## 2024-01-13 DIAGNOSIS — N6489 Other specified disorders of breast: Secondary | ICD-10-CM

## 2024-05-22 ENCOUNTER — Other Ambulatory Visit (INDEPENDENT_AMBULATORY_CARE_PROVIDER_SITE_OTHER): Payer: Self-pay | Admitting: Primary Care

## 2024-05-22 DIAGNOSIS — N6489 Other specified disorders of breast: Secondary | ICD-10-CM

## 2024-05-22 DIAGNOSIS — N6310 Unspecified lump in the right breast, unspecified quadrant: Secondary | ICD-10-CM

## 2024-05-30 ENCOUNTER — Ambulatory Visit
Admission: RE | Admit: 2024-05-30 | Discharge: 2024-05-30 | Disposition: A | Discharge status: Home / Self Care | Source: Ambulatory Visit | Attending: Primary Care | Admitting: Primary Care

## 2024-05-30 ENCOUNTER — Other Ambulatory Visit (INDEPENDENT_AMBULATORY_CARE_PROVIDER_SITE_OTHER): Payer: Self-pay | Admitting: Primary Care

## 2024-05-30 DIAGNOSIS — N6310 Unspecified lump in the right breast, unspecified quadrant: Secondary | ICD-10-CM

## 2024-05-30 DIAGNOSIS — N6489 Other specified disorders of breast: Secondary | ICD-10-CM

## 2024-05-30 DIAGNOSIS — N6320 Unspecified lump in the left breast, unspecified quadrant: Secondary | ICD-10-CM

## 2024-05-30 DIAGNOSIS — R928 Other abnormal and inconclusive findings on diagnostic imaging of breast: Secondary | ICD-10-CM

## 2024-05-31 ENCOUNTER — Other Ambulatory Visit (INDEPENDENT_AMBULATORY_CARE_PROVIDER_SITE_OTHER): Payer: Self-pay | Admitting: Primary Care

## 2024-05-31 DIAGNOSIS — N6320 Unspecified lump in the left breast, unspecified quadrant: Secondary | ICD-10-CM

## 2024-05-31 DIAGNOSIS — R928 Other abnormal and inconclusive findings on diagnostic imaging of breast: Secondary | ICD-10-CM

## 2024-06-01 ENCOUNTER — Telehealth: Payer: Self-pay | Admitting: Primary Care

## 2024-06-01 ENCOUNTER — Ambulatory Visit
Admission: RE | Admit: 2024-06-01 | Discharge: 2024-06-01 | Disposition: A | Discharge status: Home / Self Care | Source: Ambulatory Visit | Attending: Primary Care | Admitting: Primary Care

## 2024-06-01 DIAGNOSIS — R928 Other abnormal and inconclusive findings on diagnostic imaging of breast: Secondary | ICD-10-CM

## 2024-06-01 DIAGNOSIS — N6320 Unspecified lump in the left breast, unspecified quadrant: Secondary | ICD-10-CM

## 2024-06-01 HISTORY — PX: BREAST BIOPSY: SHX20

## 2024-06-01 NOTE — Telephone Encounter (Signed)
 Copied from CRM [id number]. Topic: Clinical - Request for Lab/Test Order >> Jun 01, 2024  8:45 AM Carlatta H wrote: Reason for CRM: Rebecca Huber will be sending urgent form to be completed for the patient or her appointment for today will be cancelled//

## 2024-06-04 LAB — SURGICAL PATHOLOGY

## 2024-06-05 ENCOUNTER — Telehealth: Payer: Self-pay | Admitting: *Deleted

## 2024-06-05 NOTE — Telephone Encounter (Signed)
 Called patient and reviewed appt information for [facility] clinic. She will be here at 1215 on 9/3. Aware to come to [facility] and will see all her providers/team here in this location. No other questions at this time.

## 2024-06-08 ENCOUNTER — Encounter: Payer: Self-pay | Admitting: *Deleted

## 2024-06-08 DIAGNOSIS — Z170 Estrogen receptor positive status [ER+]: Secondary | ICD-10-CM | POA: Insufficient documentation

## 2024-06-11 DIAGNOSIS — C801 Malignant (primary) neoplasm, unspecified: Secondary | ICD-10-CM

## 2024-06-11 HISTORY — DX: Malignant (primary) neoplasm, unspecified: C80.1

## 2024-06-13 ENCOUNTER — Encounter: Payer: Self-pay | Admitting: *Deleted

## 2024-06-13 ENCOUNTER — Inpatient Hospital Stay: Attending: Hematology and Oncology

## 2024-06-13 ENCOUNTER — Ambulatory Visit: Payer: Self-pay | Admitting: General Surgery

## 2024-06-13 ENCOUNTER — Encounter: Payer: Self-pay | Admitting: General Practice

## 2024-06-13 ENCOUNTER — Inpatient Hospital Stay: Admitting: Genetic Counselor

## 2024-06-13 ENCOUNTER — Inpatient Hospital Stay (HOSPITAL_BASED_OUTPATIENT_CLINIC_OR_DEPARTMENT_OTHER): Admitting: Hematology and Oncology

## 2024-06-13 ENCOUNTER — Encounter: Payer: Self-pay | Admitting: Genetic Counselor

## 2024-06-13 ENCOUNTER — Ambulatory Visit: Attending: General Surgery | Admitting: Physical Therapy

## 2024-06-13 ENCOUNTER — Ambulatory Visit
Admission: RE | Admit: 2024-06-13 | Discharge: 2024-06-13 | Disposition: A | Discharge status: Home / Self Care | Source: Ambulatory Visit | Attending: Radiation Oncology | Admitting: Radiation Oncology

## 2024-06-13 VITALS — BP 142/74 | HR 106 | Temp 98.4°F | Resp 20 | Ht 67.0 in | Wt 198.9 lb

## 2024-06-13 DIAGNOSIS — Z79899 Other long term (current) drug therapy: Secondary | ICD-10-CM | POA: Diagnosis not present

## 2024-06-13 DIAGNOSIS — Z801 Family history of malignant neoplasm of trachea, bronchus and lung: Secondary | ICD-10-CM | POA: Diagnosis not present

## 2024-06-13 DIAGNOSIS — Z170 Estrogen receptor positive status [ER+]: Secondary | ICD-10-CM

## 2024-06-13 DIAGNOSIS — Z1732 Human epidermal growth factor receptor 2 negative status: Secondary | ICD-10-CM | POA: Insufficient documentation

## 2024-06-13 DIAGNOSIS — C50312 Malignant neoplasm of lower-inner quadrant of left female breast: Secondary | ICD-10-CM | POA: Insufficient documentation

## 2024-06-13 DIAGNOSIS — Z1721 Progesterone receptor positive status: Secondary | ICD-10-CM | POA: Diagnosis not present

## 2024-06-13 DIAGNOSIS — I10 Essential (primary) hypertension: Secondary | ICD-10-CM | POA: Insufficient documentation

## 2024-06-13 DIAGNOSIS — Z1239 Encounter for other screening for malignant neoplasm of breast: Secondary | ICD-10-CM | POA: Insufficient documentation

## 2024-06-13 DIAGNOSIS — R293 Abnormal posture: Secondary | ICD-10-CM | POA: Insufficient documentation

## 2024-06-13 LAB — GENETIC SCREENING ORDER

## 2024-06-13 LAB — CMP (CANCER CENTER ONLY)
ALT: 9 U/L (ref 0–44)
AST: 13 U/L — ABNORMAL LOW (ref 15–41)
Albumin: 4 g/dL (ref 3.5–5.0)
Alkaline Phosphatase: 74 U/L (ref 38–126)
Anion gap: 11 (ref 5–15)
BUN: 31 mg/dL — ABNORMAL HIGH (ref 8–23)
CO2: 22 mmol/L (ref 22–32)
Calcium: 9.1 mg/dL (ref 8.9–10.3)
Chloride: 106 mmol/L (ref 98–111)
Creatinine: 1.32 mg/dL — ABNORMAL HIGH (ref 0.44–1.00)
GFR, Estimated: 45 mL/min — ABNORMAL LOW (ref >60)
Glucose, Bld: 174 mg/dL — ABNORMAL HIGH (ref 70–99)
Potassium: 3.2 mmol/L — ABNORMAL LOW (ref 3.5–5.1)
Sodium: 139 mmol/L (ref 135–145)
Total Bilirubin: 0.5 mg/dL (ref 0.0–1.2)
Total Protein: 7.5 g/dL (ref 6.5–8.1)

## 2024-06-13 LAB — CBC WITH DIFFERENTIAL (CANCER CENTER ONLY)
Abs Immature Granulocytes: 0.05 K/uL (ref 0.00–0.07)
Basophils Absolute: 0 K/uL (ref 0.0–0.1)
Basophils Relative: 0 %
Eosinophils Absolute: 0.1 K/uL (ref 0.0–0.5)
Eosinophils Relative: 2 %
HCT: 33.7 % — ABNORMAL LOW (ref 36.0–46.0)
Hemoglobin: 11.4 g/dL — ABNORMAL LOW (ref 12.0–15.0)
Immature Granulocytes: 1 %
Lymphocytes Relative: 35 %
Lymphs Abs: 2.8 K/uL (ref 0.7–4.0)
MCH: 25 pg — ABNORMAL LOW (ref 26.0–34.0)
MCHC: 33.8 g/dL (ref 30.0–36.0)
MCV: 73.9 fL — ABNORMAL LOW (ref 80.0–100.0)
Monocytes Absolute: 0.6 K/uL (ref 0.1–1.0)
Monocytes Relative: 8 %
Neutro Abs: 4.4 K/uL (ref 1.7–7.7)
Neutrophils Relative %: 54 %
Platelet Count: 240 K/uL (ref 150–400)
RBC: 4.56 MIL/uL (ref 3.87–5.11)
RDW: 14.8 % (ref 11.5–15.5)
WBC Count: 8 K/uL (ref 4.0–10.5)
nRBC: 0 % (ref 0.0–0.2)

## 2024-06-13 NOTE — Progress Notes (Signed)
 [facility] Spiritual Care Note  Met with Karena in Breast Multidisciplinary Clinic to introduce Support Center team/resources. She completed SDOH screening; results follow below.  SDOH Interventions    Flowsheet Row Office Visit from 06/13/2024 in [facility] A [facility] Of [place]  SDOH Interventions   Food Insecurity Interventions Intervention Not Indicated  Housing Interventions Intervention Not Indicated  Transportation Interventions Intervention Not Indicated  Utilities Interventions Intervention Not Indicated    SDOH Screenings   Food Insecurity: No Food Insecurity (06/13/2024)  Housing: Unknown (06/13/2024)  Transportation Needs: No Transportation Needs (06/13/2024)  Utilities: Not At Risk (06/13/2024)  Depression (PHQ2-9): Low Risk  (06/13/2024)  Physical Activity: Sufficiently Active (07/07/2023)  Tobacco Use: Low Risk  (06/13/2024)  Health Literacy: Adequate Health Literacy (07/07/2023)    Chaplain and patient discussed common feelings and emotions when being diagnosed with cancer, and the importance of support during treatment.  Chaplain informed patient of the support team and support services at [facility].  Chaplain provided contact information and encouraged patient to call with any questions or concerns. Ms Hagey reports that she is feeling relieved after [facility] and is interested in support programming.  Follow up needed: No. Ms Jeane plans to contact Spiritual Care as needed/desired for follow-up support.   [address] Olam Corr[place], [place], [facility] Pager [phone] Voicemail [phone]

## 2024-06-13 NOTE — Assessment & Plan Note (Signed)
 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 After reviewing the pathology in detail, we proceeded to discuss the different treatment options between surgery, radiation, chemotherapy, antiestrogen therapies.  Recommendations: 1. Breast conserving surgery followed by 2. Oncotype DX testing to determine if chemotherapy would be of any benefit followed by 3. Adjuvant radiation therapy followed by 4. Adjuvant antiestrogen therapy  Oncotype counseling: I discussed Oncotype DX test. I explained to the patient that this is a 21 gene panel to evaluate patient tumors DNA to calculate recurrence score. This would help determine whether patient has high risk or low risk breast cancer. She understands that if her tumor was found to be high risk, she would benefit from systemic chemotherapy. If low risk, no need of chemotherapy.  Return to clinic after surgery to discuss final pathology report and then determine if Oncotype DX testing will need to be sent.

## 2024-06-13 NOTE — Progress Notes (Signed)
 Radiation Oncology         ([phone]) [phone] ________________________________  Name: Rebecca Huber        MRN: [id number]  Date of Service: 06/13/2024 DOB: 03/23/1961  RR:Zitjmid, Rosaline SQUIBB, NP  Curvin Deward MOULD, MD     REFERRING PHYSICIAN: Curvin Deward III, MD   DIAGNOSIS: The encounter diagnosis was Malignant neoplasm of lower-inner quadrant of left breast in female, estrogen receptor positive (HCC).   HISTORY OF PRESENT ILLNESS: Rebecca Huber is a 63 y.o. female seen in the multidisciplinary breast clinic for a new diagnosis of left breast cancer. The patient was noted to have a right breast asymmetry that was followed for several years and recent screening left distortion. She returned for diagnostic imaging on 05/30/24 and mammographically the right breast finding was stable and the left distortion persisted. By ultrasound the area in the left breast measured 7 Huber in the 8:00 position. Her axilla was negative for adenopathy. 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 The tumor was ER/PR positive HER2 negative with a Ki 67 of 5%. She's seen today to discuss treatment recommendations.     PREVIOUS RADIATION THERAPY: No   PAST MEDICAL HISTORY:  Past Medical History:  Diagnosis Date   Hypertension        PAST SURGICAL HISTORY: Past Surgical History:  Procedure Laterality Date   BREAST BIOPSY Left 06/01/2024   US  LT BREAST BX W LOC DEV 1ST LESION IMG BX SPEC US  GUIDE 06/01/2024 [facility] MAMMOGRAP[facility]     FAMILY HISTORY:  Family History  Problem Relation Age of Onset   Lung cancer Mother 75     SOCIAL HISTORY:  reports that she has never smoked. She has never used smokeless tobacco. She reports current alcohol use of about 1.0 standard drink of alcohol per week. She reports that she does not use drugs. The patient is married and lives in [place]. She works as a Copy at [employer]. She has a close family that enjoys  getting together.    ALLERGIES: Patient has no known allergies.   MEDICATIONS:  Current Outpatient Medications  Medication Sig Dispense Refill   amLODipine  (NORVASC ) 10 MG tablet Take one tablet daily by mouth 90 tablet 1   atorvastatin  (LIPITOR) 10 MG tablet Take 1 tablet (10 mg total) by mouth daily. 90 tablet 3   hydrochlorothiazide  (HYDRODIURIL ) 25 MG tablet Take 1 tablet (25 mg total) by mouth daily. 90 tablet 3   losartan  (COZAAR ) 100 MG tablet Take 1 tablet (100 mg total) by mouth daily. 90 tablet 1   No current facility-administered medications for this encounter.     REVIEW OF SYSTEMS: On review of systems, the patient reports that she is doing well overall. No breast specific complaints are verbalized.      PHYSICAL EXAM:  Wt Readings from Last 3 Encounters:  06/13/24 198 lb 14.4 oz (90.2 kg)  01/11/24 198 lb 9.6 oz (90.1 kg)  07/07/23 197 lb 6.4 oz (89.5 kg)   Temp Readings from Last 3 Encounters:  06/13/24 98.4 F (36.9 C) (Temporal)  05/10/22 98.5 F (36.9 C) (Oral)  12/01/21 97.8 F (36.6 C) (Oral)   BP Readings from Last 3 Encounters:  06/13/24 (!) 142/74  01/11/24 127/78  07/07/23 130/77   Pulse Readings from Last 3 Encounters:  06/13/24 (!) 106  01/11/24 85  07/07/23 69    In general this is a well appearing [place] female  in no acute distress. She's alert and oriented x4 and appropriate throughout the examination. Cardiopulmonary assessment is negative for acute distress and she exhibits normal effort. Bilateral breast exam is deferred.    ECOG = 1  0 - Asymptomatic (Fully active, able to carry on all predisease activities without restriction)  1 - Symptomatic but completely ambulatory (Restricted in physically strenuous activity but ambulatory and able to carry out work of a light or sedentary nature. For example, light housework, office work)  2 - Symptomatic, <50% in bed during the day (Ambulatory and capable of all self care but  unable to carry out any work activities. Up and about more than 50% of waking hours)  3 - Symptomatic, >50% in bed, but not bedbound (Capable of only limited self-care, confined to bed or chair 50% or more of waking hours)  4 - Bedbound (Completely disabled. Cannot carry on any self-care. Totally confined to bed or chair)  5 - Death   Rebecca Huber, Creech RH, Tormey DC, et al. [phone]). Toxicity and response criteria of the [facility]. Am. Rebecca Huber. Oncol. 5 (6): 649-55    LABORATORY DATA:  Lab Results  Component Value Date   WBC 8.0 06/13/2024   HGB 11.4 (L) 06/13/2024   HCT 33.7 (L) 06/13/2024   MCV 73.9 (L) 06/13/2024   PLT 240 06/13/2024   Lab Results  Component Value Date   NA 139 06/13/2024   K 3.2 (L) 06/13/2024   CL 106 06/13/2024   CO2 22 06/13/2024   Lab Results  Component Value Date   ALT 9 06/13/2024   AST 13 (L) 06/13/2024   ALKPHOS 74 06/13/2024   BILITOT 0.5 06/13/2024      RADIOGRAPHY: US  LT BREAST BX W LOC DEV 1ST LESION IMG BX SPEC US  GUIDE Addendum Date: 06/04/2024 ADDENDUM REPORT: 06/04/2024 13:01 ADDENDUM: PATHOLOGY revealed: 1. Breast, left, needle core biopsy, LIQ : INVASIVE MAMMARY CARCINOMA, MAMMARY CARCINOMA IN SITU, SOLID, INTERMEDIATE NUCLEAR GRADE, WITHOUT NECROSIS, OVERALL GRADE: 2. - LYMPHOVASCULAR INVASION: NOT IDENTIFIED - CANCER LENGTH: 0.6 CM - CALCIFICATIONS: PRESENT Pathology results are CONCORDANT with imaging findings, per Dr. Dina Arceo. Pathology results and recommendations were discussed with patient via telephone on 06/04/2024 by Rock Hover RN. Patient reported biopsy site doing well with no adverse symptoms, and slight tenderness at the site. Post biopsy care instructions were reviewed, questions were answered and my direct phone number was provided. Patient was instructed to call [facility] for any additional questions or concerns related to biopsy site. RECOMMENDATIONS: 1. Surgical and  oncological consultation. Patient was referred to the [facility] with appointment on 06/13/2024. Pathology results reported by Rock Hover RN on 06/04/2024. Electronically Signed   By: Dina  Arceo M.D.   On: 06/04/2024 13:01   Result Date: 06/04/2024 CLINICAL DATA:  Left breast mass. EXAM: ULTRASOUND GUIDED LEFT BREAST CORE NEEDLE BIOPSY COMPARISON:  Previous exam(s). PROCEDURE: I met with the patient and we discussed the procedure of ultrasound-guided biopsy, including benefits and alternatives. We discussed the high likelihood of a successful procedure. We discussed the risks of the procedure, including infection, bleeding, tissue injury, clip migration, and inadequate sampling. Informed written consent was given. The usual time-out protocol was performed immediately prior to the procedure. Lesion quadrant: Lower inner quadrant Using sterile technique and 1% lidocaine and 1% lidocaine with epinephrine as local anesthetic, under direct ultrasound visualization, a 14 gauge spring-loaded device was  used to perform biopsy of a mass in the 8 o'clock region of the left breast 1 cm from the nipple using a lateral to medial approach. At the conclusion of the procedure coil shaped tissue marker clip was deployed into the biopsy cavity. Follow up 2 view mammogram was performed and dictated separately. IMPRESSION: Ultrasound guided biopsy of the left breast. No apparent complications. Electronically Signed: By: Dina  Arceo M.D. On: 06/01/2024 15:17   Huber CLIP PLACEMENT LEFT Result Date: 06/04/2024 CLINICAL DATA:  Status post ultrasound-guided core biopsy of a left breast mass. EXAM: 3D DIAGNOSTIC LEFT MAMMOGRAM POST ULTRASOUND BIOPSY COMPARISON:  Previous exam(s). ACR Breast Density Category b: There are scattered areas of fibroglandular density. FINDINGS: 3D Mammographic images were obtained following ultrasound guided biopsy of the left breast. The  biopsy marking clip is in expected location in the 8 o'clock region of the left breast. IMPRESSION: Appropriate positioning of the coil shaped biopsy marking clip at the site of biopsy in the 8 o'clock region of the left breast. Final Assessment: Post Procedure Mammograms for Marker Placement Electronically Signed   By: Harrie Deis M.D.   On: 06/04/2024 05:07   Huber 3D DIAGNOSTIC MAMMOGRAM BILATERAL BREAST Result Date: 05/30/2024 CLINICAL DATA:  Delayed follow-up of 2 RIGHT breast asymmetries identified on 01/25/2020. Annual LEFT mammogram. EXAM: DIGITAL DIAGNOSTIC BILATERAL MAMMOGRAM WITH TOMOSYNTHESIS AND CAD; ULTRASOUND RIGHT BREAST LIMITED; ULTRASOUND LEFT BREAST LIMITED TECHNIQUE: Bilateral digital diagnostic mammography and breast tomosynthesis was performed. The images were evaluated with computer-aided detection. ; Targeted ultrasound examination of the right breast was performed; Targeted ultrasound examination of the left breast was performed. COMPARISON:  Previous exam(s). ACR Breast Density Category b: There are scattered areas of fibroglandular density. FINDINGS: RIGHT: Mammogram: Previously seen asymmetry in the upper-outer quadrant of the RIGHT breast at posterior depth, is unchanged since 01/25/2020 which is consistent with a benign etiology. No suspicious mass, distortion, or microcalcifications are identified to suggest presence of malignancy. Ultrasound: Targeted sonographic evaluation of the RIGHT breast was performed. 0.5 x 0.3 x 0.4 cm circumscribed oval hypoechoic structure at 9 o'clock 5 CMFN is not significantly changed since 01/25/2020 which indicates a benign etiology, most likely a simple cyst. 0.5 x 0.2 x 0.4 cm circumscribed oval hypoechoic structure at 12 o'clock 5 CMFN is not significantly changed in size since 01/25/2020 which indicates a benign etiology, most likely a minimally complicated cyst or fibroadenoma. 0.3 x 0.2 x 0.3 cm circumscribed oval anechoic mass at 3 o'clock 4 CMFN  is consistent with a benign simple cyst. LEFT: Mammogram: Asymmetry with distortion seen in the lower inner LEFT breast, at middle depth, persists on spot compression images. No additional suspicious mass, asymmetry, or microcalcifications of the LEFT breast. Ultrasound: Targeted sonographic evaluation of the LEFT breast demonstrates an irregularly shaped hypoechoic anti parallel mass with indistinct margins and posterior acoustic shadowing measuring 0.7 x 0.4 x 0.5 cm (8 o'clock 1 CMFN), which corresponds to the asymmetry with distortion seen on mammogram. No enlarged LEFT axillary lymph nodes are present. IMPRESSION: 1. Suspicious 0.7 cm LEFT breast mass (8 o'clock 1 CMFN). 2. RIGHT breast asymmetries and masses demonstrate greater than 2 year stability, consistent with benign etiology. RECOMMENDATION: Ultrasound-guided core needle biopsy of 0.7 cm LEFT breast mass (8 o'clock 1 CMFN). I have discussed the findings and recommendations with the patient. The biopsy procedure was explained to the patient and questions were answered. Patient expressed their understanding of the biopsy recommendation. Patient will be scheduled for biopsy at her  earliest convenience by the schedulers. Ordering provider will be notified. If applicable, a reminder letter will be sent to the patient regarding the next appointment. BI-RADS CATEGORY  5: Highly suggestive of malignancy. Electronically Signed   By: Aliene Lloyd M.D.   On: 05/30/2024 16:21   US  LIMITED ULTRASOUND INCLUDING AXILLA RIGHT BREAST Result Date: 05/30/2024 CLINICAL DATA:  Delayed follow-up of 2 RIGHT breast asymmetries identified on 01/25/2020. Annual LEFT mammogram. EXAM: DIGITAL DIAGNOSTIC BILATERAL MAMMOGRAM WITH TOMOSYNTHESIS AND CAD; ULTRASOUND RIGHT BREAST LIMITED; ULTRASOUND LEFT BREAST LIMITED TECHNIQUE: Bilateral digital diagnostic mammography and breast tomosynthesis was performed. The images were evaluated with computer-aided detection. ; Targeted  ultrasound examination of the right breast was performed; Targeted ultrasound examination of the left breast was performed. COMPARISON:  Previous exam(s). ACR Breast Density Category b: There are scattered areas of fibroglandular density. FINDINGS: RIGHT: Mammogram: Previously seen asymmetry in the upper-outer quadrant of the RIGHT breast at posterior depth, is unchanged since 01/25/2020 which is consistent with a benign etiology. No suspicious mass, distortion, or microcalcifications are identified to suggest presence of malignancy. Ultrasound: Targeted sonographic evaluation of the RIGHT breast was performed. 0.5 x 0.3 x 0.4 cm circumscribed oval hypoechoic structure at 9 o'clock 5 CMFN is not significantly changed since 01/25/2020 which indicates a benign etiology, most likely a simple cyst. 0.5 x 0.2 x 0.4 cm circumscribed oval hypoechoic structure at 12 o'clock 5 CMFN is not significantly changed in size since 01/25/2020 which indicates a benign etiology, most likely a minimally complicated cyst or fibroadenoma. 0.3 x 0.2 x 0.3 cm circumscribed oval anechoic mass at 3 o'clock 4 CMFN is consistent with a benign simple cyst. LEFT: Mammogram: Asymmetry with distortion seen in the lower inner LEFT breast, at middle depth, persists on spot compression images. No additional suspicious mass, asymmetry, or microcalcifications of the LEFT breast. Ultrasound: Targeted sonographic evaluation of the LEFT breast demonstrates an irregularly shaped hypoechoic anti parallel mass with indistinct margins and posterior acoustic shadowing measuring 0.7 x 0.4 x 0.5 cm (8 o'clock 1 CMFN), which corresponds to the asymmetry with distortion seen on mammogram. No enlarged LEFT axillary lymph nodes are present. IMPRESSION: 1. Suspicious 0.7 cm LEFT breast mass (8 o'clock 1 CMFN). 2. RIGHT breast asymmetries and masses demonstrate greater than 2 year stability, consistent with benign etiology. RECOMMENDATION: Ultrasound-guided core  needle biopsy of 0.7 cm LEFT breast mass (8 o'clock 1 CMFN). I have discussed the findings and recommendations with the patient. The biopsy procedure was explained to the patient and questions were answered. Patient expressed their understanding of the biopsy recommendation. Patient will be scheduled for biopsy at her earliest convenience by the schedulers. Ordering provider will be notified. If applicable, a reminder letter will be sent to the patient regarding the next appointment. BI-RADS CATEGORY  5: Highly suggestive of malignancy. Electronically Signed   By: Aliene Lloyd M.D.   On: 05/30/2024 16:21   US  LIMITED ULTRASOUND INCLUDING AXILLA LEFT BREAST  Result Date: 05/30/2024 CLINICAL DATA:  Delayed follow-up of 2 RIGHT breast asymmetries identified on 01/25/2020. Annual LEFT mammogram. EXAM: DIGITAL DIAGNOSTIC BILATERAL MAMMOGRAM WITH TOMOSYNTHESIS AND CAD; ULTRASOUND RIGHT BREAST LIMITED; ULTRASOUND LEFT BREAST LIMITED TECHNIQUE: Bilateral digital diagnostic mammography and breast tomosynthesis was performed. The images were evaluated with computer-aided detection. ; Targeted ultrasound examination of the right breast was performed; Targeted ultrasound examination of the left breast was performed. COMPARISON:  Previous exam(s). ACR Breast Density Category b: There are scattered areas of fibroglandular density. FINDINGS: RIGHT: Mammogram: Previously seen  asymmetry in the upper-outer quadrant of the RIGHT breast at posterior depth, is unchanged since 01/25/2020 which is consistent with a benign etiology. No suspicious mass, distortion, or microcalcifications are identified to suggest presence of malignancy. Ultrasound: Targeted sonographic evaluation of the RIGHT breast was performed. 0.5 x 0.3 x 0.4 cm circumscribed oval hypoechoic structure at 9 o'clock 5 CMFN is not significantly changed since 01/25/2020 which indicates a benign etiology, most likely a simple cyst. 0.5 x 0.2 x 0.4 cm circumscribed oval  hypoechoic structure at 12 o'clock 5 CMFN is not significantly changed in size since 01/25/2020 which indicates a benign etiology, most likely a minimally complicated cyst or fibroadenoma. 0.3 x 0.2 x 0.3 cm circumscribed oval anechoic mass at 3 o'clock 4 CMFN is consistent with a benign simple cyst. LEFT: Mammogram: Asymmetry with distortion seen in the lower inner LEFT breast, at middle depth, persists on spot compression images. No additional suspicious mass, asymmetry, or microcalcifications of the LEFT breast. Ultrasound: Targeted sonographic evaluation of the LEFT breast demonstrates an irregularly shaped hypoechoic anti parallel mass with indistinct margins and posterior acoustic shadowing measuring 0.7 x 0.4 x 0.5 cm (8 o'clock 1 CMFN), which corresponds to the asymmetry with distortion seen on mammogram. No enlarged LEFT axillary lymph nodes are present. IMPRESSION: 1. Suspicious 0.7 cm LEFT breast mass (8 o'clock 1 CMFN). 2. RIGHT breast asymmetries and masses demonstrate greater than 2 year stability, consistent with benign etiology. RECOMMENDATION: Ultrasound-guided core needle biopsy of 0.7 cm LEFT breast mass (8 o'clock 1 CMFN). I have discussed the findings and recommendations with the patient. The biopsy procedure was explained to the patient and questions were answered. Patient expressed their understanding of the biopsy recommendation. Patient will be scheduled for biopsy at her earliest convenience by the schedulers. Ordering provider will be notified. If applicable, a reminder letter will be sent to the patient regarding the next appointment. BI-RADS CATEGORY  5: Highly suggestive of malignancy. Electronically Signed   By: Aliene Lloyd M.D.   On: 05/30/2024 16:21       IMPRESSION/PLAN: 1. Stage IA, [id number], grade 2, ER/R positive invasive lobular carcinoma of the left breast. Dr. Dewey discusses the pathology findings and reviews the nature of early stage breast disease. The consensus from  the breast conference includes MRI imaging for extent of disease followed by breast conservation with lumpectomy with  sentinel node biopsy. Dr. Gudena recommends Oncotype Dx score to determine a role for systemic therapy. Provided that chemotherapy is not indicated, the patient's course would then be followed by external radiotherapy to the breast  to reduce risks of local recurrence. Dr. Odean anticipates adjuvant antiestrogen therapy to follow. We discussed the risks, benefits, short, and long term effects of radiotherapy, as well as the curative intent, and the patient is interested in proceeding. Dr. Dewey discusses the delivery and logistics of radiotherapy and anticipates a course of 4 or up to 6 1/2 weeks of radiotherapy to the left breast. We will see her back a few weeks after surgery to discuss the simulation process and anticipate we starting radiotherapy about 4-6 weeks after surgery.  2. Possible genetic predisposition to malignancy. The patient is a candidate for genetic testing given her personal  history. She will meet with our geneticist today in clinic.   In a visit lasting 60 minutes, greater than 50% of the time was spent face to face reviewing her case, as well as in preparation of, discussing, and coordinating the patient's care.  The above documentation  reflects my direct findings during this shared patient visit. Please see the separate note by Dr. Dewey on this date for the remainder of the patient's plan of care.    Donald KYM Husband, [facility]    **Disclaimer: This note was dictated with voice recognition software. Similar sounding words can inadvertently be transcribed and this note may contain transcription errors which may not have been corrected upon publication of note.**

## 2024-06-13 NOTE — Progress Notes (Signed)
 REFERRING PROVIDER: Curvin Deward MOULD, MD [address] [place],  [place] [address]  PRIMARY PROVIDER:  Celestia Rosaline SQUIBB, NP  PRIMARY REASON FOR VISIT:  1. Malignant neoplasm of lower-inner quadrant of left breast in female, estrogen receptor positive (HCC)      HISTORY OF PRESENT ILLNESS:   Ms. Kotowski, a 63 y.o. female, was seen for a [facility] cancer genetics consultation at the request of Dr. Curvin due to a personal history of breast cancer.  Ms. Jock presents to clinic today to discuss the possibility of a hereditary predisposition to cancer, genetic testing, and to further clarify her future cancer risks, as well as potential cancer risks for family members.   In August 2025, at the age of 89, Ms. Copelin was diagnosed with cancer of the left breast.     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%, HER2 negative, axilla negative   06/13/2024 Cancer Staging   Staging form: Breast, AJCC 8th Edition - Clinical stage from 06/13/2024: Stage IA (cT1b, cN0, cM0, G2, ER+, PR+, HER2-) - Signed by Lanell Donald Stagger, PA-C on 06/13/2024 Stage prefix: Initial diagnosis Method of lymph node assessment: Clinical Histologic grading system: 3 grade system      Past Medical History:  Diagnosis Date   Hypertension     Past Surgical History:  Procedure Laterality Date   BREAST BIOPSY Left 06/01/2024   US  LT BREAST BX W LOC DEV 1ST LESION IMG BX SPEC US  GUIDE 06/01/2024 [facility]AMMOGR[facility]    Social History   Socioeconomic History   Marital status: Married    Spouse name: Not on file   Number of children: Not on file   Years of education: Not on file   Highest education level:  Not on file  Occupational History   Not on file  Tobacco Use   Smoking status: Never   Smokeless tobacco: Never  Substance and Sexual Activity   Alcohol use: Yes    Alcohol/week: 1.0 standard drink of alcohol    Types: 1 Cans of beer per week    Comment: one can of beer after work   Drug use: Never   Sexual activity: Not Currently  Other Topics Concern   Not on file  Social History Narrative   Not on file   Social Drivers of Health   Financial Resource Strain: Not on file  Food Insecurity: No Food Insecurity (06/13/2024)   Hunger Vital Sign    Worried About Running Out of Food in the Last Year: Never true    Ran Out of Food in the Last Year: Never true  Transportation Needs: No Transportation Needs (06/13/2024)   PRAPARE - Administrator, Civil Service (Medical): No    Lack of Transportation (Non-Medical): No  Physical Activity: Sufficiently Active (07/07/2023)   Exercise Vital Sign    Days of Exercise per Week: 5 days    Minutes of Exercise per Session: 150+ min  Stress: Not on file  Social Connections: Not on file     FAMILY HISTORY:  We obtained a detailed, 4-generation family history.  Significant diagnoses are listed below: Family History  Problem Relation Age of Onset   Lung cancer Mother 73  The patient has three sons who are cancer free.  She has six maternal half siblings who are cancer free.  Both parents are deceased.  The patient's father died at 23.  There is no information known on his side of the family.  The patient's mother died of lung cancer.  She had three brothers who were cancer free.  There is no other reported family history of cancer.  Ms. Downum is unaware of previous family history of genetic testing for hereditary cancer risks. There is no reported Ashkenazi Jewish ancestry. There is no known consanguinity.  GENETIC COUNSELING ASSESSMENT: Ms. Earll is a 63 y.o. female with a personal history of breast cancer which is somewhat  suggestive of a hereditary cancer syndrome and predisposition to cancer given her age of onset and limited family history on the paternal side of the family. We, therefore, discussed and recommended the following at today's visit.   DISCUSSION: We discussed that, in general, most cancer is not inherited in families, but instead is sporadic or familial. Sporadic cancers occur by chance and typically happen at older ages (>50 years) as this type of cancer is caused by genetic changes acquired during an individual's lifetime. Some families have more cancers than would be expected by chance; however, the ages or types of cancer are not consistent with a known genetic mutation or known genetic mutations have been ruled out. This type of familial cancer is thought to be due to a combination of multiple genetic, environmental, hormonal, and lifestyle factors. While this combination of factors likely increases the risk of cancer, the exact source of this risk is not currently identifiable or testable.  We discussed that 5 - 10% of breast cancer is hereditary, with most cases associated with BRCA mutations.  There are other genes that can be associated with hereditary breast cancer syndromes.  These include ATM, CHEK2 and PALB2.  We discussed that testing is beneficial for several reasons including knowing how to follow individuals after completing their treatment, identifying whether potential treatment options such as PARP inhibitors would be beneficial, and understand if other family members could be at risk for cancer and allow them to undergo genetic testing.   We reviewed the characteristics, features and inheritance patterns of hereditary cancer syndromes. We also discussed genetic testing, including the appropriate family members to test, the process of testing, insurance coverage and turn-around-time for results. We discussed the implications of a negative, positive, carrier and/or variant of uncertain  significant result. Ms. Stober  was offered a common hereditary cancer panel (36+ genes) and an expanded pan-cancer panel (70+ genes). Ms. Surowiec was informed of the benefits and limitations of each panel, including that expanded pan-cancer panels contain genes that do not have clear management guidelines at this point in time.  We also discussed that as the number of genes included on a panel increases, the chances of variants of uncertain significance increases. Ms. Redmann decided to pursue genetic testing for the CancerNext-Expanded+RNAinsight gene panel.   The CancerNext-Expanded gene panel offered by [facility] and includes sequencing, rearrangement, and RNA analysis for the following 77 genes: AIP, ALK, APC, ATM, BAP1, BARD1, BMPR1A, BRCA1, BRCA2, BRIP1, CDC73, CDH1, CDK4, CDKN1B, CDKN2A, CEBPA, CHEK2, CTNNA1, DDX41, DICER1, ETV6, FH, FLCN, GATA2, LZTR1, MAX, MBD4, MEN1, MET, MLH1, MSH2, MSH3, MSH6, MUTYH, NF1, NF2, NTHL1, PALB2, PHOX2B, PMS2, POT1, PRKAR1A, PTCH1, PTEN, RAD51C, RAD51D, RB1, RET, RPS20, RUNX1, SDHA, SDHAF2, SDHB, SDHC, SDHD, SMAD4, SMARCA4, SMARCB1, SMARCE1, STK11, SUFU, TMEM127, TP53, TSC1, TSC2, VHL,  and WT1 (sequencing and deletion/duplication); AXIN2, CTNNA1, DDX41, EGFR, HOXB13, KIT, MBD4, MITF, MSH3, PDGFRA, POLD1 and POLE (sequencing only); EPCAM and GREM1 (deletion/duplication only). RNA data is routinely analyzed for use in variant interpretation for all genes.   Based on Ms. Atienza's personal history of cancer, she meets NCCN criteria for genetic testing. Despite that she meets criteria, she may still have an out of pocket cost. We discussed that if her out of pocket cost for testing is over $100, the laboratory will call and confirm whether she wants to proceed with testing.  If the out of pocket cost of testing is less than $100 she will be billed by the genetic testing laboratory.   PLAN: After considering the risks, benefits, and limitations, Ms. Dasie provided informed  consent to pursue genetic testing and the blood sample was sent to [facility] for analysis of the CancerNext-Expanded+RNAinsight. Results should be available within approximately 2-3 weeks' time, at which point they will be disclosed by telephone to Ms. Dasie, as will any additional recommendations warranted by these results. Ms. Lynam will receive a summary of her genetic counseling visit and a copy of her results once available. This information will also be available in Epic.   Lastly, we encouraged Ms. Zaman to remain in contact with cancer genetics annually so that we can continuously update the family history and inform her of any changes in cancer genetics and testing that may be of benefit for this family.   Ms. Sebek questions were answered to her satisfaction today. Our contact information was provided should additional questions or concerns arise. Thank you for the referral and allowing us  to share in the care of your patient.   Riely Oetken P. Perri, MS, CGC Licensed, Patent attorney [email or web address] phone: [phone]  In total, 35 minutes were spent on the date of the encounter in service to the patient including preparation, face-to-face consultation, documentation and care coordination.  The patient was seen alone.  Drs. Lanny Stalls, and/or Gudena were available for questions, if needed..    _______________________________________________________________________ For Office Staff:  Number of people involved in session: 1 Was an Intern/ student involved with case: no

## 2024-06-13 NOTE — Progress Notes (Signed)
 [facility] CONSULT NOTE  Patient Care Team: Celestia Rosaline SQUIBB, NP as PCP - General (Internal Medicine) Tyree Nanetta SAILOR, RN as Oncology Nurse Navigator Gerome, Devere HERO, RN as Oncology Nurse Navigator Curvin Deward MOULD, MD as Consulting Physician (General Surgery) Odean Potts, MD as Consulting Physician (Hematology and Oncology) Dewey Rush, MD as Consulting Physician (Radiation Oncology)  CHIEF COMPLAINTS/PURPOSE OF CONSULTATION:  Newly diagnosed breast cancer  HISTORY OF PRESENTING ILLNESS: Ms. Cloninger is a 63 year old who had a screening mammogram detected right breast asymmetry which was benign.  There was a left breast asymmetry and distortion which an ultrasound measured 0.7 cm.  Axilla is negative and biopsy will grade 2 invasive lobular cancer with LCIS ER 95% PR 100% Ki67 5%, HER2 negative.  She was presented this morning at the multidisciplinary tumor board and she is here today to discuss her treatment plan.  I reviewed her records extensively and collaborated the history with the patient.  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%, HER2 negative, axilla negative   06/13/2024 Cancer Staging   Staging form: Breast, AJCC 8th Edition - Clinical stage from 06/13/2024: Stage IA (cT1b, cN0, cM0, G2, ER+, PR+, HER2-) - Signed by Lanell Donald Stagger, PA-C on 06/13/2024 Stage prefix: Initial diagnosis Method of lymph node assessment: Clinical Histologic grading system: 3 grade system      MEDICAL HISTORY:  Past Medical History:  Diagnosis Date   Hypertension     SURGICAL HISTORY: Past Surgical History:  Procedure Laterality Date   BREAST BIOPSY Left  06/01/2024   US  LT BREAST BX W LOC DEV 1ST LESION IMG BX SPEC US  GUIDE 06/01/2024 [facility] MAMMOGR[facility]    SOCIAL HISTORY: Social History   Socioeconomic History   Marital status: Married    Spouse name: Not on file   Number of children: Not on file   Years of education: Not on file   Highest education level: Not on file  Occupational History   Not on file  Tobacco Use   Smoking status: Never   Smokeless tobacco: Never  Substance and Sexual Activity   Alcohol use: Yes    Alcohol/week: 1.0 standard drink of alcohol    Types: 1 Cans of beer per week    Comment: one can of beer after work   Drug use: Never   Sexual activity: Not Currently  Other Topics Concern   Not on file  Social History Narrative   Not on file   Social Drivers of Health   Financial Resource Strain: Not on file  Food Insecurity: No Food Insecurity (06/13/2024)   Hunger Vital Sign    Worried About Running Out of Food in the Last Year: Never true    Ran Out of Food in the Last Year: Never true  Transportation Needs: No Transportation Needs (06/13/2024)   PRAPARE - Administrator, Civil Service (Medical): No    Lack of Transportation (Non-Medical): No  Physical Activity: Sufficiently Active (07/07/2023)   Exercise Vital Sign    Days of Exercise per Week: 5 days    Minutes of Exercise per Session: 150+ min  Stress: Not on file  Social Connections: Not on file  Intimate Partner Violence: Not  At Risk (06/13/2024)   Humiliation, Afraid, Rape, and Kick questionnaire    Fear of Current or Ex-Partner: No    Emotionally Abused: No    Physically Abused: No    Sexually Abused: No    FAMILY HISTORY: Family History  Problem Relation Age of Onset   Lung cancer Mother 18    ALLERGIES:  has no known allergies.  MEDICATIONS:  Current Outpatient Medications  Medication Sig Dispense Refill   amLODipine  (NORVASC ) 10 MG tablet Take one tablet daily by mouth 90 tablet 1   atorvastatin  (LIPITOR) 10 MG tablet  Take 1 tablet (10 mg total) by mouth daily. 90 tablet 3   hydrochlorothiazide  (HYDRODIURIL ) 25 MG tablet Take 1 tablet (25 mg total) by mouth daily. 90 tablet 3   losartan  (COZAAR ) 100 MG tablet Take 1 tablet (100 mg total) by mouth daily. 90 tablet 1   No current facility-administered medications for this visit.    REVIEW OF SYSTEMS:   Constitutional: Denies fevers, chills or abnormal night sweats Breast:  Denies any palpable lumps or discharge All other systems were reviewed with the patient and are negative.  PHYSICAL EXAMINATION: ECOG PERFORMANCE STATUS: 1 - Symptomatic but completely ambulatory  Vitals:   06/13/24 1230  BP: (!) 142/74  Pulse: (!) 106  Resp: 20  Temp: 98.4 F (36.9 C)  SpO2: 100%   Filed Weights   06/13/24 1230  Weight: 198 lb 14.4 oz (90.2 kg)    GENERAL:alert, no distress and comfortable    LABORATORY DATA:  I have reviewed the data as listed Lab Results  Component Value Date   WBC 8.0 06/13/2024   HGB 11.4 (L) 06/13/2024   HCT 33.7 (L) 06/13/2024   MCV 73.9 (L) 06/13/2024   PLT 240 06/13/2024   Lab Results  Component Value Date   NA 139 06/13/2024   K 3.2 (L) 06/13/2024   CL 106 06/13/2024   CO2 22 06/13/2024    RADIOGRAPHIC STUDIES: I have personally reviewed the radiological reports and agreed with the findings in the report.  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 After reviewing the pathology in detail,  we proceeded to discuss the different treatment options between surgery, radiation, chemotherapy, antiestrogen therapies.  Recommendations: 1. Breast conserving surgery followed by 2. Oncotype DX testing to determine if chemotherapy would be of any benefit followed by 3. Adjuvant radiation therapy followed by 4. Adjuvant antiestrogen therapy  Oncotype counseling: I discussed Oncotype DX test. I explained to the patient that this is a 21 gene panel to evaluate patient tumors DNA to calculate recurrence score. This would help determine whether patient has high risk or low risk breast cancer. She understands that if her tumor was found to be high risk, she would benefit from systemic chemotherapy. If low risk, no need of chemotherapy.  Return to clinic after surgery to discuss final pathology report and then determine if Oncotype DX testing will need to be sent.      All questions were answered. The patient knows to call the clinic with  any problems, questions or concerns.    Viinay K Adriann Ballweg, MD 06/13/24

## 2024-06-13 NOTE — Research (Signed)
 [employer] 2021-05 - Specimen Collection Study to Evaluate Biomarkers in Subjects with Cancer    Patient Rebecca Huber was identified by Dr.Gudena as a potential candidate for the above listed study.  This Clinical Research Nurse met with Rebecca Huber, [id number], on 06/13/24 in a manner and location that ensures patient privacy to discuss participation in the above listed research study.  Patient is Unaccompanied.  A copy of the informed consent document with embedded HIPAA language was provided to the patient.  Patient reads, speaks, and understands [place].   Patient was provided with the business card of this Nurse and encouraged to contact the research team with any questions.  Approximately 10 minutes were spent with the patient reviewing the informed consent documents.  Patient was provided the option of taking informed consent documents home to review and was encouraged to review at their convenience with their support network, including other care providers. Patient took the consent documents home to review.  The pt was informed that she will need to consent and have her blood drawn before her surgery or before any treatment. The pt was told that her participation is optional for this study and that she would get a $50 gift card if she participates in the study with a successful blood sample. She stated that we can follow up with her on Monday 06/18/24 after 3pm. She did not have any other questions at this time. Pt was thanked for her time and consideration.   Mazie Larsen, RN, BSN Clinical Research Nurse [phone] 06/13/2024

## 2024-06-14 ENCOUNTER — Encounter: Payer: Self-pay | Admitting: Physical Therapy

## 2024-06-14 ENCOUNTER — Other Ambulatory Visit: Payer: Self-pay

## 2024-06-14 NOTE — Therapy (Addendum)
 OUTPATIENT PHYSICAL THERAPY BREAST CANCER BASELINE EVALUATION   Patient Name: Rebecca Huber MRN: [id number] DOB:05-14-1961, 63 y.o., female Today's Date: 06/14/2024  END OF SESSION:  PT End of Session - 06/14/24 0823     Visit Number 1    Number of Visits 2    Date for PT Re-Evaluation 08/08/24    PT Start Time 1342    PT Stop Time 1415    PT Time Calculation (min) 33 min    Activity Tolerance Patient tolerated treatment well    Behavior During Therapy [facility] for tasks assessed/performed          Past Medical History:  Diagnosis Date   Hypertension    Past Surgical History:  Procedure Laterality Date   BREAST BIOPSY Left 06/01/2024   US  LT BREAST BX W LOC DEV 1ST LESION IMG BX SPEC US  GUIDE 06/01/2024 [facility] MAMMOGRAPHY   Patient Active Problem List   Diagnosis Date Noted   Malignant neoplasm of lower-inner quadrant of left breast in female, estrogen receptor positive (HCC) 06/08/2024   Salmonella enteritis 09/07/2019   Enteropathogenic Escherichia coli infection 09/07/2019   AKI (acute kidney injury) (HCC) 09/05/2019    REFERRING PROVIDER: Dr. Deward Null  REFERRING DIAG: Left breast cancer  THERAPY DIAG:  Malignant neoplasm of lower-inner quadrant of left breast in female, estrogen receptor positive (HCC)  Abnormal posture  Rationale for Evaluation and Treatment: Rehabilitation  ONSET DATE: 05/30/2024  SUBJECTIVE:                                                                                                                                                                                           SUBJECTIVE STATEMENT: Patient reports she is here today to be seen by her medical team for her newly diagnosed left breast cancer.   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 It measures 7 mm and is located in the lower inner quadrant. It is ER/PR positive and HER2 negative with a Ki67 of 5%.    PATIENT GOALS:   reduce lymphedema risk and learn post op HEP.   PAIN:  Are you having pain? No  PRECAUTIONS: Active CA   RED FLAGS: None   HAND DOMINANCE: left  WEIGHT BEARING RESTRICTIONS: No  FALLS:  Has patient fallen in last 6 months? 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  COGNITION: Overall cognitive status: Within functional limits for tasks assessed    POSTURE:  Forward head and rounded shoulders posture  UPPER EXTREMITY AROM/PROM:  A/PROM RIGHT   eval   Shoulder extension 63  Shoulder flexion 158  Shoulder abduction 157  Shoulder internal rotation 62  Shoulder external rotation 72    (Blank rows = not tested)  A/PROM LEFT   eval  Shoulder extension 57  Shoulder flexion 141  Shoulder abduction 162  Shoulder internal rotation 71  Shoulder external rotation 80    (Blank rows = not tested)  CERVICAL AROM: All within normal limits  UPPER EXTREMITY STRENGTH: WNL  LYMPHEDEMA ASSESSMENTS (in cm):   LANDMARK RIGHT   eval  10 cm proximal to olecranon process 35.2  Olecranon process 30.2  10 cm proximal to ulnar styloid process 23.8  Just proximal to ulnar styloid process 17.7  Across hand at thumb web space 20.4  At base of 2nd digit 6.7  (Blank rows = not tested)  LANDMARK LEFT   eval  10 cm proximal to olecranon process 36.5  Olecranon process 30.3  10 cm proximal to ulnar styloid process 23  Just proximal to ulnar styloid process 18.2  Across hand at thumb web space 20.2  At base of 2nd digit 6.7  (Blank rows = not tested)  L-DEX LYMPHEDEMA SCREENING:  The patient was assessed using the L-Dex machine today to  produce a lymphedema index baseline score. The patient will be reassessed on a regular basis (typically every 3 months) to obtain new L-Dex scores. If the score is > 6.5 points away from his/her baseline score indicating onset of subclinical lymphedema, it will be recommended to wear a compression garment for 4 weeks, 12 hours per day and then be reassessed. If the score continues to be > 6.5 points from baseline at reassessment, we will initiate lymphedema treatment. Assessing in this manner has a 95% rate of preventing clinically significant lymphedema.   L-DEX FLOWSHEETS - 06/14/24 0800       L-DEX LYMPHEDEMA SCREENING   Measurement Type Unilateral    L-DEX MEASUREMENT EXTREMITY Upper Extremity    POSITION  Standing    DOMINANT SIDE Left    At Risk Side Left    BASELINE SCORE (UNILATERAL) 3.4          QUICK DASH SURVEY:  [name] - 06/14/24 0001     Open a tight or new jar No difficulty    Do heavy household chores (wash walls, wash floors) No difficulty    Carry a shopping bag or briefcase No difficulty    Wash your back No difficulty    Use a knife to cut food No difficulty    Recreational activities in which you take some force or impact through your arm, shoulder, or hand (golf, hammering, tennis) No difficulty    During the past week, to what extent has your arm, shoulder or hand problem interfered with your normal social activities with family, friends, neighbors, or groups? Not at all    During the past week, to what extent has your arm, shoulder or hand problem limited your work or other regular daily activities Not at all    Arm, shoulder, or hand pain. None    Tingling (pins and needles) in your arm, shoulder, or hand None    Difficulty Sleeping No difficulty    DASH Score 0 %  PATIENT EDUCATION:  Education details: Time spent educating patient on aspects of self-care to maximize post op recovery. Patient was educated on where and how to get a post op  compression bra to use to reduce post op edema. Patient was also educated on the use of SOZO screenings and surveillance principles for early identification of lymphedema onset. She was instructed to use the post op pillow in the axilla for pressure and pain relief. Patient educated on lymphedema risk reduction and post op shoulder/posture HEP. Person educated: Patient Education method: Explanation, Demonstration, Handout Education comprehension: Patient verbalized understanding and returned demonstration  HOME EXERCISE PROGRAM: Patient was instructed today in a home exercise program today for post op shoulder range of motion. These included active assist shoulder flexion in sitting, scapular retraction, wall walking with shoulder abduction, and hands behind head external rotation.  She was encouraged to do these twice a day, holding 3 seconds and repeating 5 times when permitted by her physician.   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 It measures 7 mm and is located in the lower inner quadrant. It is ER/PR positive and HER2 negative with a Ki67 of 5%. Her multidisciplinary medical team met prior to her assessments to determine a recommended treatment plan. She is planning to have a left lumpectomy and sentinel node biopsy followed by Oncotype testing, radiation, and anti-estrogen therapy. She will benefit from a post op PT reassessment to determine needs and from L-Dex screens every 3 months for 2 years to detect subclinical lymphedema.  Pt will benefit from skilled therapeutic intervention to improve on the following deficits: Decreased knowledge of precautions, impaired UE functional use, pain, decreased ROM, postural dysfunction.   PT treatment/interventions: ADL/self-care home management, pt/family education, therapeutic exercise  REHAB POTENTIAL: Excellent  CLINICAL DECISION MAKING:  Stable/uncomplicated  EVALUATION COMPLEXITY: Low   GOALS: Goals reviewed with patient? YES  LONG TERM GOALS: (STG=LTG)    Name Target Date Goal status  1 Pt will be able to verbalize understanding of pertinent lymphedema risk reduction practices relevant to her dx specifically related to skin care.  Baseline:  No knowledge 06/14/2024 Achieved at eval  2 Pt will be able to return demo and/or verbalize understanding of the post op HEP related to regaining shoulder ROM. Baseline:  No knowledge 06/14/2024 Achieved at eval  3 Pt will be able to verbalize understanding of the importance of viewing the post op After Breast CA Class video for further lymphedema risk reduction education and therapeutic exercise.  Baseline:  No knowledge 06/14/2024 Achieved at eval  4 Pt will demo she has regained full shoulder ROM and function post operatively compared to baselines.  Baseline: See objective measurements taken today. 08/08/2024     PLAN:  PT FREQUENCY/DURATION: EVAL and 1 follow up appointment.   PLAN FOR NEXT SESSION: will reassess 3-4 weeks post op to determine needs.   Patient will follow up at outpatient cancer rehab 3-4 weeks following surgery.  If the patient requires physical therapy at that time, a specific plan will be dictated and sent to the referring physician for approval. The patient was educated today on appropriate basic range of motion exercises to begin post operatively and the importance of viewing the After Breast Cancer class video following surgery.  Patient was educated today on lymphedema risk reduction practices as it pertains to recommendations that will benefit the patient immediately following surgery.  She verbalized good understanding.  Physical Therapy Information for After Breast Cancer Surgery/Treatment:  Lymphedema is a swelling condition that you may be at risk for in your arm if you have lymph nodes removed from the armpit area.  After a sentinel node biopsy, the  risk is approximately 5-9% and is higher after an axillary node dissection.  There is treatment available for this condition and it is not life-threatening.  Contact your physician or physical therapist with concerns. You may begin the 4 shoulder/posture exercises (see additional sheet) when permitted by your physician (typically a week after surgery).  If you have drains, you may need to wait until those are removed before beginning range of motion exercises.  A general recommendation is to not lift your arms above shoulder height until drains are removed.  These exercises should be done to your tolerance and gently.  This is not a no pain/no gain type of recovery so listen to your body and stretch into the range of motion that you can tolerate, stopping if you have pain.  If you are having immediate reconstruction, ask your plastic surgeon about doing exercises as he or she may want you to wait. We encourage you to view the After Breast Cancer class video following surgery.  You will learn information related to lymphedema risk, prevention and treatment and additional exercises to regain mobility following surgery.   While undergoing any medical procedure or treatment, try to avoid blood pressure being taken or needle sticks from occurring on the arm on the side of cancer.   This recommendation begins after surgery and continues for the rest of your life.  This may help reduce your risk of getting lymphedema (swelling in your arm). An excellent resource for those seeking information on lymphedema is the National Lymphedema Network's web site. It can be accessed at www.lymphnet.org If you notice swelling in your hand, arm or breast at any time following surgery (even if it is many years from now), please contact your doctor or physical therapist to discuss this.  Lymphedema can be treated at any time but it is easier for you if it is treated early on.  If you feel like your shoulder motion is not returning  to normal in a reasonable amount of time, please contact your surgeon or physical therapist.  [facility] [phone]. [address], [address], [place] [address]  [facility] CLASS After Breast Cancer Class  After Breast Cancer Class is a specially designed exercise class video to assist you in a safe recover after having breast cancer surgery.  In this video you will learn how to get back to full function whether your drains were just removed or if you had surgery a month ago. The video can be viewed on this page: [email or web address]-meyer.[email or web address] or on YouTube here: https://[email or web address].  Class Goals  Understand specific stretches to improve the flexibility of you chest and shoulder. Learn ways to safely strengthen your upper body and improve your posture. Understand the warning signs of infection and why you may be at risk for an arm infection. Learn about Lymphedema and prevention.  ** You do not need to view this video until after surgery.  Drains should be removed to participate in the recommended exercises on the video.  Patient was instructed today in a home exercise program today for post op shoulder range of motion. These included active assist shoulder flexion in sitting, scapular retraction, wall walking with shoulder abduction, and hands behind head external rotation.  She was  encouraged to do these twice a day, holding 3 seconds and repeating 5 times when permitted by her physician.  Eward Wonda Sharps, Gray 06/14/24 8:28 AM

## 2024-06-15 ENCOUNTER — Other Ambulatory Visit: Payer: Self-pay | Admitting: General Surgery

## 2024-06-15 DIAGNOSIS — C50312 Malignant neoplasm of lower-inner quadrant of left female breast: Secondary | ICD-10-CM

## 2024-06-18 ENCOUNTER — Telehealth: Payer: Self-pay | Admitting: *Deleted

## 2024-06-18 DIAGNOSIS — C50312 Malignant neoplasm of lower-inner quadrant of left female breast: Secondary | ICD-10-CM

## 2024-06-18 NOTE — Telephone Encounter (Addendum)
 [employer] 2021-05 - Specimen Collection Study to Evaluate Biomarkers in Subjects with Cancer    Follow up with pt today regarding above study. Pt stated she is not interested in participating in the study because she does not want to give away any more blood. Informed the pt to reach out to the clinical research team if she has any questions or changes her mind. She verbalized understanding and did not have any more questions. Dr.Gudena notified of pt declining study.   Rebecca Larsen, RN, BSN Clinical Research Nurse [phone] 06/18/2024

## 2024-06-21 ENCOUNTER — Encounter: Payer: Self-pay | Admitting: *Deleted

## 2024-06-21 ENCOUNTER — Ambulatory Visit
Admission: RE | Admit: 2024-06-21 | Discharge: 2024-06-21 | Disposition: A | Discharge status: Home / Self Care | Source: Ambulatory Visit | Attending: General Surgery | Admitting: General Surgery

## 2024-06-21 DIAGNOSIS — Z170 Estrogen receptor positive status [ER+]: Secondary | ICD-10-CM

## 2024-06-21 MED ORDER — GADOPICLENOL 0.5 MMOL/ML IV SOLN
9.0000 mL | Freq: Once | INTRAVENOUS | Status: AC | PRN
  Administered 2024-06-21: 9 mL via INTRAVENOUS

## 2024-06-22 ENCOUNTER — Other Ambulatory Visit: Payer: Self-pay

## 2024-06-22 ENCOUNTER — Encounter (HOSPITAL_BASED_OUTPATIENT_CLINIC_OR_DEPARTMENT_OTHER): Payer: Self-pay | Admitting: General Surgery

## 2024-06-22 ENCOUNTER — Other Ambulatory Visit: Payer: Self-pay | Admitting: General Surgery

## 2024-06-22 ENCOUNTER — Other Ambulatory Visit: Payer: Self-pay | Admitting: *Deleted

## 2024-06-22 DIAGNOSIS — R928 Other abnormal and inconclusive findings on diagnostic imaging of breast: Secondary | ICD-10-CM

## 2024-06-22 DIAGNOSIS — R9389 Abnormal findings on diagnostic imaging of other specified body structures: Secondary | ICD-10-CM

## 2024-06-22 DIAGNOSIS — Z170 Estrogen receptor positive status [ER+]: Secondary | ICD-10-CM

## 2024-06-25 ENCOUNTER — Encounter (HOSPITAL_BASED_OUTPATIENT_CLINIC_OR_DEPARTMENT_OTHER)
Admission: RE | Admit: 2024-06-25 | Discharge: 2024-06-25 | Disposition: A | Discharge status: Home / Self Care | Source: Ambulatory Visit | Attending: General Surgery | Admitting: General Surgery

## 2024-06-25 ENCOUNTER — Encounter: Payer: Self-pay | Admitting: *Deleted

## 2024-06-25 ENCOUNTER — Ambulatory Visit: Payer: Self-pay | Admitting: Genetic Counselor

## 2024-06-25 ENCOUNTER — Encounter: Payer: Self-pay | Admitting: Genetic Counselor

## 2024-06-25 ENCOUNTER — Telehealth: Payer: Self-pay | Admitting: Genetic Counselor

## 2024-06-25 DIAGNOSIS — Z01810 Encounter for preprocedural cardiovascular examination: Secondary | ICD-10-CM | POA: Insufficient documentation

## 2024-06-25 DIAGNOSIS — Z1379 Encounter for other screening for genetic and chromosomal anomalies: Secondary | ICD-10-CM

## 2024-06-25 DIAGNOSIS — Z01818 Encounter for other preprocedural examination: Secondary | ICD-10-CM | POA: Diagnosis present

## 2024-06-25 DIAGNOSIS — I10 Essential (primary) hypertension: Secondary | ICD-10-CM | POA: Diagnosis not present

## 2024-06-25 NOTE — Progress Notes (Signed)
 HPI:  [name] was previously seen in the [facility] due to a personal and family history of cancer and concerns regarding a hereditary predisposition to cancer. Please refer to our prior cancer genetics clinic note for more information regarding our discussion, assessment and recommendations, at the time. [name] recent genetic test results were disclosed to her, as were recommendations warranted by these results. These results and recommendations are discussed in more detail below.  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%, HER2 negative, axilla negative   06/13/2024 Cancer Staging   Staging form: Breast, AJCC 8th Edition - Clinical stage from 06/13/2024: Stage IA (cT1b, cN0, cM0, G2, ER+, PR+, HER2-) - Signed by Lanell Donald Stagger, PA-C on 06/13/2024 Stage prefix: Initial diagnosis Method of lymph node assessment: Clinical Histologic grading system: 3 grade system   06/22/2024 Genetic Testing   Negative genetic testing on the CancerNext-Expanded+RNAinsight panel.  The report date is June 22, 2024.  The CancerNext-Expanded gene panel offered by [facility] and includes sequencing, rearrangement, and RNA analysis for the following 77 genes: AIP, ALK, APC, ATM, BAP1, BARD1, BMPR1A, BRCA1, BRCA2, BRIP1, CDC73, CDH1, CDK4, CDKN1B, CDKN2A, CEBPA, CHEK2, CTNNA1, DDX41, DICER1, ETV6, FH, FLCN, GATA2, LZTR1, MAX, MBD4, MEN1, MET, MLH1, MSH2, MSH3, MSH6, MUTYH, NF1, NF2, NTHL1, PALB2, PHOX2B, PMS2, POT1, PRKAR1A, PTCH1, PTEN, RAD51C, RAD51D, RB1, RET, RPS20, RUNX1, SDHA, SDHAF2, SDHB, SDHC, SDHD, SMAD4, SMARCA4, SMARCB1, SMARCE1, STK11, SUFU, TMEM127, TP53, TSC1, TSC2,  VHL, and WT1 (sequencing and deletion/duplication); AXIN2, CTNNA1, DDX41, EGFR, HOXB13, KIT, MBD4, MITF, MSH3, PDGFRA, POLD1 and POLE (sequencing only); EPCAM and GREM1 (deletion/duplication only). RNA data is routinely analyzed for use in variant interpretation for all genes.      FAMILY HISTORY:  We obtained a detailed, 4-generation family history.  Significant diagnoses are listed below: Family History  Problem Relation Age of Onset   Lung cancer Mother 39       The patient has three sons who are cancer free.  She has six maternal half siblings who are cancer free.  Both parents are deceased.   The patient's father died at 42.  There is no information known on his side of the family.   The patient's mother died of lung cancer.  She had three brothers who were cancer free.  There is no other reported family history of cancer.   [name] is unaware of previous family history of genetic testing for hereditary cancer risks. There is no reported Ashkenazi Jewish ancestry. There is no known consanguinity.  GENETIC TEST RESULTS: Genetic testing reported out on June 22, 2024 through the CancerNext-Expanded+RNAinsight cancer panel found no pathogenic mutations. The [facility] CancerNext+RNAinsight Panel includes sequencing, rearrangement analysis, and RNA analysis for the following 39 genes: APC, ATM, BAP1, BARD1, BMPR1A, BRCA1, BRCA2, BRIP1, CDH1, CDKN2A, CHEK2, FH, FLCN, MET, MLH1, MSH2, MSH6, MUTYH, NF1, NTHL1, PALB2, PMS2, PTEN, RAD51C, RAD51D, SMAD4, STK11, TP53, TSC1, TSC2, and VHL (sequencing and deletion/duplication); AXIN2, HOXB13, MBD4, MSH3, POLD1 and POLE (sequencing only); EPCAM and GREM1 (deletion/duplication only). The test report has been scanned into EPIC and is located under the Molecular Pathology section of the Results Review tab.  A portion of the result report is included below for  reference.     We discussed with [name] that because current genetic testing is not  perfect, it is possible there may be a gene mutation in one of these genes that current testing cannot detect, but that chance is small.  We also discussed, that there could be another gene that has not yet been discovered, or that we have not yet tested, that is responsible for the cancer diagnoses in the family. It is also possible there is a hereditary cause for the cancer in the family that [name] did not inherit and therefore was not identified in her testing.  Therefore, it is important to remain in touch with cancer genetics in the future so that we can continue to offer [name] the most up to date genetic testing.   ADDITIONAL GENETIC TESTING: We discussed with [name] that her genetic testing was fairly extensive.  If there are genes identified to increase cancer risk that can be analyzed in the future, we would be happy to discuss and coordinate this testing at that time.    CANCER SCREENING RECOMMENDATIONS: [name] test result is considered negative (normal).  This means that we have not identified a hereditary cause for her personal history of breast cancer at this time. Most cancers happen by chance and this negative test suggests that her personal history of breast cancer may fall into this category.    Possible reasons for [name]'s negative genetic test include:  1. There may be a gene mutation in one of these genes that current testing methods cannot detect but that chance is small.  2. There could be another gene that has not yet been discovered, or that we have not yet tested, that is responsible for the cancer diagnoses in the family.  3.  There may be no hereditary risk for cancer in the family. The cancers in [name] and/or her family may be sporadic/familial or due to other genetic and environmental factors. 4. It is also possible there is a hereditary cause for the cancer in the family that [name] did not inherit.  Therefore, it is recommended she continue to  follow the cancer management and screening guidelines provided by her oncology and primary healthcare provider. An individual's cancer risk and medical management are not determined by genetic test results alone. Overall cancer risk assessment incorporates additional factors, including personal medical history, family history, and any available genetic information that may result in a personalized plan for cancer prevention and surveillance  RECOMMENDATIONS FOR FAMILY MEMBERS:   Since she did not inherit a identifiable mutation in a cancer predisposition gene included on this panel, her children could not have inherited a known mutation from her in one of these genes. Individuals in this family might be at some increased risk of developing cancer, over the general population risk, simply due to the family history of cancer.  We recommended women in this family have a yearly mammogram beginning at age 52, or 27 years younger than the earliest onset of cancer, an annual clinical breast exam, and perform monthly breast self-exams. Women in this family should also have a gynecological exam as recommended by their primary provider. All family members should be referred for colonoscopy starting at age 66, or 48 years younger than the earliest onset of cancer.  FOLLOW-UP: Lastly, we discussed with [name] that cancer genetics is a rapidly advancing field and it is possible that new genetic tests will be appropriate for her and/or her  family members in the future. We encouraged her to remain in contact with cancer genetics on an annual basis so we can update her personal and family histories and let her know of advances in cancer genetics that may benefit this family.   Our contact number was provided. [name] questions were answered to her satisfaction, and she knows she is welcome to call us  at anytime with additional questions or concerns.   Darice Monte, MS, [facility] Licensed, Certified Genetic  Counselor [email or web address]

## 2024-06-25 NOTE — Progress Notes (Signed)
      Enhanced Recovery after Surgery Enhanced Recovery after Surgery is a protocol used to improve the stress on your body and your recovery after surgery.  Patient Instructions  The night before surgery:  No food after midnight. ONLY clear liquids after midnight  The day of surgery (if you do NOT have diabetes):  Drink ONE (1) Pre-Surgery Clear Ensure as directed.   This drink was given to you during your hospital  pre-op appointment visit. The pre-op nurse will instruct you on the time to drink the  Pre-Surgery Ensure depending on your surgery time. Finish the drink at the designated time by the pre-op nurse.  Nothing else to drink after completing the  Pre-Surgery Clear Ensure.  The day of surgery (if you have diabetes): Drink ONE (1) Gatorade 2 (G2) as directed. This drink was given to you during your hospital  pre-op appointment visit.  The pre-op nurse will instruct you on the time to drink the   Gatorade 2 (G2) depending on your surgery time. Color of the Gatorade may vary. Red is not allowed. Nothing else to drink after completing the  Gatorade 2 (G2).         If you have questions, please contact your surgeon's office.   Patient given CHG presurgical soap. Education provided and patient verbalized understanding.

## 2024-06-25 NOTE — Telephone Encounter (Signed)
 I contacted  Rebecca Huber to discuss her genetic testing results. No pathogenic variants were identified in the 77 genes analyzed. Discussed that we do not know why she has breast cancer or why there is cancer in the family. It could be due to a different gene that we are not testing, or maybe our current technology may not be able to pick something up.  It will be important for her to keep in contact with genetics to keep up with whether additional testing may be needed.Detailed clinic note to follow.   The test report will be scanned into EPIC and will be located under the Molecular Pathology section of the Results Review tab.  A portion of the result report is included below for reference.

## 2024-06-28 ENCOUNTER — Ambulatory Visit
Admission: RE | Admit: 2024-06-28 | Discharge: 2024-06-28 | Disposition: A | Discharge status: Home / Self Care | Source: Ambulatory Visit | Attending: General Surgery | Admitting: General Surgery

## 2024-06-28 ENCOUNTER — Encounter

## 2024-06-28 DIAGNOSIS — R9389 Abnormal findings on diagnostic imaging of other specified body structures: Secondary | ICD-10-CM

## 2024-06-28 MED ORDER — GADOPICLENOL 0.5 MMOL/ML IV SOLN
9.0000 mL | Freq: Once | INTRAVENOUS | Status: AC | PRN
  Administered 2024-06-28: 9 mL via INTRAVENOUS

## 2024-06-29 ENCOUNTER — Encounter

## 2024-06-29 ENCOUNTER — Other Ambulatory Visit (HOSPITAL_COMMUNITY)

## 2024-06-29 ENCOUNTER — Ambulatory Visit (HOSPITAL_BASED_OUTPATIENT_CLINIC_OR_DEPARTMENT_OTHER): Admission: RE | Admit: 2024-06-29 | Source: Home / Self Care | Admitting: General Surgery

## 2024-06-29 ENCOUNTER — Encounter (HOSPITAL_BASED_OUTPATIENT_CLINIC_OR_DEPARTMENT_OTHER): Admission: RE | Payer: Self-pay | Source: Home / Self Care

## 2024-06-29 ENCOUNTER — Encounter (HOSPITAL_COMMUNITY)
Admission: RE | Admit: 2024-06-29 | Discharge: 2024-06-29 | Disposition: A | Discharge status: Home / Self Care | Source: Ambulatory Visit | Attending: General Surgery | Admitting: General Surgery

## 2024-06-29 ENCOUNTER — Encounter: Payer: Self-pay | Admitting: *Deleted

## 2024-06-29 DIAGNOSIS — Z170 Estrogen receptor positive status [ER+]: Secondary | ICD-10-CM | POA: Insufficient documentation

## 2024-06-29 DIAGNOSIS — I10 Essential (primary) hypertension: Secondary | ICD-10-CM

## 2024-06-29 DIAGNOSIS — C50312 Malignant neoplasm of lower-inner quadrant of left female breast: Secondary | ICD-10-CM

## 2024-06-29 HISTORY — DX: Hyperlipidemia, unspecified: E78.5

## 2024-06-29 LAB — SURGICAL PATHOLOGY

## 2024-06-29 SURGERY — BREAST LUMPECTOMY WITH RADIOACTIVE SEED AND SENTINEL LYMPH NODE BIOPSY
Anesthesia: General | Site: Breast | Laterality: Left

## 2024-07-04 ENCOUNTER — Encounter: Payer: Self-pay | Admitting: *Deleted

## 2024-07-09 ENCOUNTER — Encounter: Payer: Self-pay | Admitting: *Deleted

## 2024-07-10 ENCOUNTER — Other Ambulatory Visit: Payer: Self-pay

## 2024-07-10 ENCOUNTER — Encounter (HOSPITAL_BASED_OUTPATIENT_CLINIC_OR_DEPARTMENT_OTHER): Payer: Self-pay | Admitting: General Surgery

## 2024-07-12 ENCOUNTER — Encounter (HOSPITAL_BASED_OUTPATIENT_CLINIC_OR_DEPARTMENT_OTHER)
Admission: RE | Admit: 2024-07-12 | Discharge: 2024-07-12 | Disposition: A | Discharge status: Home / Self Care | Source: Ambulatory Visit | Attending: General Surgery | Admitting: General Surgery

## 2024-07-12 ENCOUNTER — Ambulatory Visit (INDEPENDENT_AMBULATORY_CARE_PROVIDER_SITE_OTHER): Admitting: Primary Care

## 2024-07-12 DIAGNOSIS — I10 Essential (primary) hypertension: Secondary | ICD-10-CM | POA: Insufficient documentation

## 2024-07-12 DIAGNOSIS — Z01812 Encounter for preprocedural laboratory examination: Secondary | ICD-10-CM | POA: Insufficient documentation

## 2024-07-12 LAB — BASIC METABOLIC PANEL WITH GFR
Anion gap: 11 (ref 5–15)
BUN: 36 mg/dL — ABNORMAL HIGH (ref 8–23)
CO2: 20 mmol/L — ABNORMAL LOW (ref 22–32)
Calcium: 9 mg/dL (ref 8.9–10.3)
Chloride: 105 mmol/L (ref 98–111)
Creatinine, Ser: 2.27 mg/dL — ABNORMAL HIGH (ref 0.44–1.00)
GFR, Estimated: 23 mL/min — ABNORMAL LOW (ref >60)
Glucose, Bld: 155 mg/dL — ABNORMAL HIGH (ref 70–99)
Potassium: 3.2 mmol/L — ABNORMAL LOW (ref 3.5–5.1)
Sodium: 136 mmol/L (ref 135–145)

## 2024-07-12 MED ORDER — CHLORHEXIDINE GLUCONATE CLOTH 2 % EX PADS: 6.0000 | MEDICATED_PAD | Freq: Once | CUTANEOUS | Status: DC

## 2024-07-12 NOTE — Progress Notes (Signed)

## 2024-07-13 DIAGNOSIS — Z01812 Encounter for preprocedural laboratory examination: Secondary | ICD-10-CM | POA: Diagnosis not present

## 2024-07-13 LAB — BASIC METABOLIC PANEL WITH GFR
Anion gap: 10 (ref 5–15)
BUN: 48 mg/dL — ABNORMAL HIGH (ref 8–23)
CO2: 22 mmol/L (ref 22–32)
Calcium: 8.9 mg/dL (ref 8.9–10.3)
Chloride: 102 mmol/L (ref 98–111)
Creatinine, Ser: 1.93 mg/dL — ABNORMAL HIGH (ref 0.44–1.00)
GFR, Estimated: 28 mL/min — ABNORMAL LOW (ref >60)
Glucose, Bld: 169 mg/dL — ABNORMAL HIGH (ref 70–99)
Potassium: 4 mmol/L (ref 3.5–5.1)
Sodium: 134 mmol/L — ABNORMAL LOW (ref 135–145)

## 2024-07-13 NOTE — Progress Notes (Addendum)
 Labs reviewed with Dr. Lucious, need repeat BMET for creatinine.  Spoke with patient who states she will come today for repeat labs.  Addendum: repeat labs drawn, awaiting results.

## 2024-07-17 ENCOUNTER — Ambulatory Visit
Admission: RE | Admit: 2024-07-17 | Discharge: 2024-07-17 | Disposition: A | Discharge status: Home / Self Care | Source: Ambulatory Visit | Attending: General Surgery | Admitting: General Surgery

## 2024-07-17 DIAGNOSIS — C50312 Malignant neoplasm of lower-inner quadrant of left female breast: Secondary | ICD-10-CM

## 2024-07-17 HISTORY — PX: BREAST BIOPSY: SHX20

## 2024-07-18 ENCOUNTER — Ambulatory Visit (HOSPITAL_COMMUNITY)
Admission: RE | Admit: 2024-07-18 | Discharge: 2024-07-18 | Disposition: A | Discharge status: Home / Self Care | Source: Ambulatory Visit | Attending: General Surgery | Admitting: General Surgery

## 2024-07-18 ENCOUNTER — Ambulatory Visit (HOSPITAL_BASED_OUTPATIENT_CLINIC_OR_DEPARTMENT_OTHER): Payer: Self-pay | Admitting: Anesthesiology

## 2024-07-18 ENCOUNTER — Other Ambulatory Visit: Payer: Self-pay

## 2024-07-18 ENCOUNTER — Encounter (HOSPITAL_BASED_OUTPATIENT_CLINIC_OR_DEPARTMENT_OTHER)
Admission: RE | Disposition: A | Discharge status: Home / Self Care | Payer: Self-pay | Source: Home / Self Care | Attending: General Surgery

## 2024-07-18 ENCOUNTER — Ambulatory Visit
Admission: RE | Admit: 2024-07-18 | Discharge: 2024-07-18 | Disposition: A | Discharge status: Home / Self Care | Source: Ambulatory Visit | Attending: General Surgery | Admitting: General Surgery

## 2024-07-18 ENCOUNTER — Ambulatory Visit (HOSPITAL_BASED_OUTPATIENT_CLINIC_OR_DEPARTMENT_OTHER)
Admission: RE | Admit: 2024-07-18 | Discharge: 2024-07-18 | Disposition: A | Discharge status: Home / Self Care | Attending: General Surgery | Admitting: General Surgery

## 2024-07-18 ENCOUNTER — Encounter (HOSPITAL_BASED_OUTPATIENT_CLINIC_OR_DEPARTMENT_OTHER): Payer: Self-pay | Admitting: General Surgery

## 2024-07-18 DIAGNOSIS — C50912 Malignant neoplasm of unspecified site of left female breast: Secondary | ICD-10-CM | POA: Diagnosis not present

## 2024-07-18 DIAGNOSIS — I10 Essential (primary) hypertension: Secondary | ICD-10-CM | POA: Insufficient documentation

## 2024-07-18 DIAGNOSIS — Z1732 Human epidermal growth factor receptor 2 negative status: Secondary | ICD-10-CM | POA: Diagnosis not present

## 2024-07-18 DIAGNOSIS — Z170 Estrogen receptor positive status [ER+]: Secondary | ICD-10-CM | POA: Insufficient documentation

## 2024-07-18 DIAGNOSIS — D0502 Lobular carcinoma in situ of left breast: Secondary | ICD-10-CM | POA: Diagnosis present

## 2024-07-18 DIAGNOSIS — Z01818 Encounter for other preprocedural examination: Secondary | ICD-10-CM

## 2024-07-18 DIAGNOSIS — Z1721 Progesterone receptor positive status: Secondary | ICD-10-CM | POA: Insufficient documentation

## 2024-07-18 DIAGNOSIS — C50312 Malignant neoplasm of lower-inner quadrant of left female breast: Secondary | ICD-10-CM

## 2024-07-18 HISTORY — PX: BREAST LUMPECTOMY WITH RADIOACTIVE SEED AND SENTINEL LYMPH NODE BIOPSY: SHX6550

## 2024-07-18 SURGERY — BREAST LUMPECTOMY WITH RADIOACTIVE SEED AND SENTINEL LYMPH NODE BIOPSY
Anesthesia: General | Site: Breast | Laterality: Left

## 2024-07-18 MED ORDER — ONDANSETRON HCL 4 MG/2ML IJ SOLN
INTRAMUSCULAR | Status: AC
  Filled 2024-07-18: qty 2

## 2024-07-18 MED ORDER — ACETAMINOPHEN 500 MG PO TABS
ORAL_TABLET | ORAL | Status: AC
  Filled 2024-07-18: qty 2

## 2024-07-18 MED ORDER — CEFAZOLIN SODIUM-DEXTROSE 2-4 GM/100ML-% IV SOLN
INTRAVENOUS | Status: AC
  Filled 2024-07-18: qty 100

## 2024-07-18 MED ORDER — BUPIVACAINE-EPINEPHRINE 0.25% -1:200000 IJ SOLN
INTRAMUSCULAR | Status: DC | PRN
  Administered 2024-07-18: 30 mL

## 2024-07-18 MED ORDER — OXYCODONE HCL 5 MG PO TABS: 5.0000 mg | ORAL_TABLET | Freq: Four times a day (QID) | ORAL | 0 refills | Status: AC | PRN

## 2024-07-18 MED ORDER — PROPOFOL 10 MG/ML IV BOLUS
INTRAVENOUS | Status: DC | PRN
  Administered 2024-07-18: 200 mg via INTRAVENOUS

## 2024-07-18 MED ORDER — MIDAZOLAM HCL 2 MG/2ML IJ SOLN
2.0000 mg | Freq: Once | INTRAMUSCULAR | Status: AC
  Administered 2024-07-18: 1 mg via INTRAVENOUS

## 2024-07-18 MED ORDER — FENTANYL CITRATE (PF) 100 MCG/2ML IJ SOLN
INTRAMUSCULAR | Status: AC
  Filled 2024-07-18: qty 2

## 2024-07-18 MED ORDER — 0.9 % SODIUM CHLORIDE (POUR BTL) OPTIME
TOPICAL | Status: DC | PRN
  Administered 2024-07-18: 500 mL

## 2024-07-18 MED ORDER — BUPIVACAINE-EPINEPHRINE (PF) 0.5% -1:200000 IJ SOLN
INTRAMUSCULAR | Status: DC | PRN
Start: 2024-07-18 — End: 2024-07-18
  Administered 2024-07-18: 20 mL via PERINEURAL

## 2024-07-18 MED ORDER — HYDROMORPHONE HCL 1 MG/ML IJ SOLN: 0.2500 mg | INTRAMUSCULAR | Status: DC | PRN

## 2024-07-18 MED ORDER — MIDAZOLAM HCL 2 MG/2ML IJ SOLN
INTRAMUSCULAR | Status: AC
  Filled 2024-07-18: qty 2

## 2024-07-18 MED ORDER — FENTANYL CITRATE (PF) 100 MCG/2ML IJ SOLN
INTRAMUSCULAR | Status: DC | PRN
  Administered 2024-07-18: 50 ug via INTRAVENOUS

## 2024-07-18 MED ORDER — TECHNETIUM TC 99M TILMANOCEPT KIT
1.0000 | PACK | Freq: Once | INTRAVENOUS | Status: AC
  Administered 2024-07-18: 1 via INTRADERMAL

## 2024-07-18 MED ORDER — LIDOCAINE HCL (CARDIAC) PF 100 MG/5ML IV SOSY
PREFILLED_SYRINGE | INTRAVENOUS | Status: DC | PRN
  Administered 2024-07-18: 40 mg via INTRAVENOUS

## 2024-07-18 MED ORDER — GABAPENTIN 100 MG PO CAPS
ORAL_CAPSULE | ORAL | Status: AC
  Filled 2024-07-18: qty 1

## 2024-07-18 MED ORDER — ONDANSETRON HCL 4 MG/2ML IJ SOLN
INTRAMUSCULAR | Status: DC | PRN
Start: 2024-07-18 — End: 2024-07-18
  Administered 2024-07-18: 4 mg via INTRAVENOUS

## 2024-07-18 MED ORDER — LIDOCAINE 2% (20 MG/ML) 5 ML SYRINGE
INTRAMUSCULAR | Status: AC
  Filled 2024-07-18: qty 5

## 2024-07-18 MED ORDER — PHENYLEPHRINE HCL (PRESSORS) 10 MG/ML IV SOLN
INTRAVENOUS | Status: DC | PRN
  Administered 2024-07-18 (×4): 80 ug via INTRAVENOUS

## 2024-07-18 MED ORDER — BUPIVACAINE-EPINEPHRINE (PF) 0.25% -1:200000 IJ SOLN
INTRAMUSCULAR | Status: AC
Start: 2024-07-18 — End: 2024-07-18
  Filled 2024-07-18: qty 30

## 2024-07-18 MED ORDER — PROPOFOL 500 MG/50ML IV EMUL
INTRAVENOUS | Status: DC | PRN
  Administered 2024-07-18: 150 ug/kg/min via INTRAVENOUS

## 2024-07-18 MED ORDER — BUPIVACAINE HCL (PF) 0.25 % IJ SOLN
INTRAMUSCULAR | Status: AC
  Filled 2024-07-18: qty 30

## 2024-07-18 MED ORDER — ACETAMINOPHEN 500 MG PO TABS
1000.0000 mg | ORAL_TABLET | ORAL | Status: AC
  Administered 2024-07-18: 1000 mg via ORAL

## 2024-07-18 MED ORDER — CEFAZOLIN SODIUM-DEXTROSE 2-4 GM/100ML-% IV SOLN
2.0000 g | INTRAVENOUS | Status: AC
  Administered 2024-07-18: 2 g via INTRAVENOUS

## 2024-07-18 MED ORDER — BUPIVACAINE LIPOSOME 1.3 % IJ SUSP
INTRAMUSCULAR | Status: DC | PRN
  Administered 2024-07-18: 10 mL via PERINEURAL

## 2024-07-18 MED ORDER — LACTATED RINGERS IV SOLN: INTRAVENOUS | Status: DC

## 2024-07-18 MED ORDER — EPHEDRINE SULFATE (PRESSORS) 50 MG/ML IJ SOLN
INTRAMUSCULAR | Status: DC | PRN
  Administered 2024-07-18: 5 mg via INTRAVENOUS

## 2024-07-18 MED ORDER — DEXAMETHASONE SODIUM PHOSPHATE 10 MG/ML IJ SOLN
INTRAMUSCULAR | Status: DC | PRN
  Administered 2024-07-18: 10 mg via INTRAVENOUS

## 2024-07-18 MED ORDER — FENTANYL CITRATE (PF) 100 MCG/2ML IJ SOLN
100.0000 ug | Freq: Once | INTRAMUSCULAR | Status: AC
  Administered 2024-07-18: 50 ug via INTRAVENOUS

## 2024-07-18 MED ORDER — GABAPENTIN 100 MG PO CAPS
100.0000 mg | ORAL_CAPSULE | ORAL | Status: AC
Start: 2024-07-18 — End: 2024-07-18
  Administered 2024-07-18: 100 mg via ORAL

## 2024-07-18 MED ORDER — DEXAMETHASONE SODIUM PHOSPHATE 10 MG/ML IJ SOLN
INTRAMUSCULAR | Status: AC
  Filled 2024-07-18: qty 1

## 2024-07-18 MED ORDER — PROPOFOL 10 MG/ML IV BOLUS
INTRAVENOUS | Status: AC
  Filled 2024-07-18: qty 20

## 2024-07-18 MED ORDER — EPHEDRINE 5 MG/ML INJ
INTRAVENOUS | Status: AC
  Filled 2024-07-18: qty 5

## 2024-07-18 SURGICAL SUPPLY — 34 items
BLADE SURG 15 STRL LF DISP TIS (BLADE) ×1 IMPLANT
CANISTER SUC SOCK COL 7IN (MISCELLANEOUS) IMPLANT
CANISTER SUCT 1200ML W/VALVE (MISCELLANEOUS) IMPLANT
CHLORAPREP W/TINT 26 (MISCELLANEOUS) ×1 IMPLANT
CLIP APPLIE 11 MED OPEN (CLIP) IMPLANT
CLIP APPLIE 9.375 MED OPEN (MISCELLANEOUS) ×1 IMPLANT
COVER BACK TABLE 60X90IN (DRAPES) ×1 IMPLANT
COVER MAYO STAND STRL (DRAPES) ×1 IMPLANT
COVER PROBE CYLINDRICAL 5X96 (MISCELLANEOUS) ×1 IMPLANT
DERMABOND ADVANCED .7 DNX12 (GAUZE/BANDAGES/DRESSINGS) ×1 IMPLANT
DRAPE LAPAROSCOPIC ABDOMINAL (DRAPES) ×1 IMPLANT
DRAPE UTILITY XL STRL (DRAPES) ×1 IMPLANT
ELECT COATED BLADE 2.86 ST (ELECTRODE) ×1 IMPLANT
ELECTRODE REM PT RTRN 9FT ADLT (ELECTROSURGICAL) ×1 IMPLANT
GLOVE BIO SURGEON STRL SZ7.5 (GLOVE) ×1 IMPLANT
GOWN STRL REUS W/ TWL LRG LVL3 (GOWN DISPOSABLE) ×2 IMPLANT
KIT MARKER MARGIN INK (KITS) ×1 IMPLANT
NDL HYPO 25X1 1.5 SAFETY (NEEDLE) ×1 IMPLANT
NDL SAFETY ECLIPSE 18X1.5 (NEEDLE) IMPLANT
NEEDLE HYPO 25X1 1.5 SAFETY (NEEDLE) ×1 IMPLANT
NS IRRIG 1000ML POUR BTL (IV SOLUTION) IMPLANT
PACK BASIN DAY SURGERY FS (CUSTOM PROCEDURE TRAY) ×1 IMPLANT
PENCIL SMOKE EVACUATOR (MISCELLANEOUS) ×1 IMPLANT
SLEEVE SCD COMPRESS KNEE MED (STOCKING) ×1 IMPLANT
SPIKE FLUID TRANSFER (MISCELLANEOUS) IMPLANT
SPONGE T-LAP 18X18 ~~LOC~~+RFID (SPONGE) ×1 IMPLANT
SUT MON AB 4-0 PC3 18 (SUTURE) ×2 IMPLANT
SUT SILK 2 0 SH (SUTURE) IMPLANT
SUT VICRYL 3-0 CR8 SH (SUTURE) ×1 IMPLANT
SYR CONTROL 10ML LL (SYRINGE) ×1 IMPLANT
TOWEL GREEN STERILE FF (TOWEL DISPOSABLE) ×1 IMPLANT
TRAY FAXITRON CT DISP (TRAY / TRAY PROCEDURE) ×1 IMPLANT
TUBE CONNECTING 20X1/4 (TUBING) IMPLANT
YANKAUER SUCT BULB TIP NO VENT (SUCTIONS) IMPLANT

## 2024-07-18 NOTE — Transfer of Care (Signed)
 Immediate Anesthesia Transfer of Care Note  Patient: Rebecca Huber  Procedure(s) Performed: BREAST LUMPECTOMY WITH RADIOACTIVE SEED AND SENTINEL LYMPH NODE BIOPSY (Left: Breast)  Patient Location: PACU  Anesthesia Type:GA combined with regional for post-op pain  Level of Consciousness: drowsy and patient cooperative  Airway & Oxygen Therapy: Patient Spontanous Breathing and Patient connected to face mask oxygen  Post-op Assessment: Report given to RN and Post -op Vital signs reviewed and stable  Post vital signs: Reviewed and stable  Last Vitals:  Vitals Value Taken Time  BP    Temp    Pulse 79 07/18/24 09:54  Resp    SpO2 99 % 07/18/24 09:54  Vitals shown include unfiled device data.  Last Pain:  Vitals:   07/18/24 0654  TempSrc: Temporal  PainSc: 0-No pain      Patients Stated Pain Goal: 4 (07/18/24 0654)  Complications: No notable events documented.

## 2024-07-18 NOTE — Progress Notes (Signed)
 Nuc Med at bedside for left breast injections.  VSS and pt tolerated well.

## 2024-07-18 NOTE — Op Note (Signed)
 07/18/2024  9:44 AM  PATIENT:  Rebecca Huber  63 y.o. female  PRE-OPERATIVE DIAGNOSIS:  LEFT BREAST CANCER  POST-OPERATIVE DIAGNOSIS:  LEFT BREAST CANCER  PROCEDURE:  Procedure(s) with comments: LEFT BREAST RADIOACTIVE SEED LOCALIZED LUMPECTOMY AND DEEP LEFT AXILLARY SENTINEL LYMPH NODE BIOPSY  SURGEON:  Surgeons and Role:    * Curvin Deward MOULD, MD - Primary  PHYSICIAN ASSISTANT:   ASSISTANTS: none   ANESTHESIA:   local and general  EBL:  10 mL   BLOOD ADMINISTERED:none  DRAINS: none   LOCAL MEDICATIONS USED:  MARCAINE     SPECIMEN:  Source of Specimen:  left breast tissue with additional anterior/lateral, superior, inferior, medial, and deep margins and sentinel node x 2  DISPOSITION OF SPECIMEN:  PATHOLOGY  COUNTS:  YES  TOURNIQUET:  * No tourniquets in log *  DICTATION: .Dragon Dictation  After informed consent was obtained the patient was brought to the operating room and placed in the supine position on the operating table.  After adequate induction of general anesthesia the patient's left chest, breast, and axillary area were prepped with ChloraPrep, allowed to dry, and draped in usual sterile manner.  An appropriate timeout was performed.  Previously an I-125 seed was placed in the inner central left breast to mark an area of invasive breast cancer.  Also earlier in the day the patient underwent injection with 1 week of technetium sulfur colloid in the subareolar position on the left breast.  The neoprobe was set to technetium and a good signal was identified in the left axilla.  The area overlying this was infiltrated with quarter percent Marcaine.  A small transversely oriented incision was made with a 15 blade knife overlying the area of radioactivity.  The incision was carried through the skin and subcutaneous tissue sharply with the electrocautery until the deep left axillary space was entered.  The neoprobe was used to direct blunt hemostat dissection.  I was able to  identify 2 lymph nodes with signal.  Each of these was excised sharply with the electrocautery and the surrounding small vessels and lymphatics were controlled with clips.  Ex vivo counts on these nodes ranged from 500 to 3000.  No other hot or palpable nodes were identified.  Hemostasis was achieved using the Bovie electrocautery.  The deep layer of the incision was closed with interrupted 3-0 Vicryl stitches.  The skin was closed with a running 4-0 Monocryl subcuticular stitch.  Attention was then turned to the left breast.  The neoprobe was set to I-125 in the area of radioactivity was readily identified.  The area around this was infiltrated with quarter percent Marcaine.  A curvilinear incision was then made along the inner edge of the areola of the left breast with a 15 blade knife.  The incision was carried through the skin and subcutaneous tissue sharply with electrocautery.  Dissection was then carried towards the radioactive seed under the direction of the neoprobe.  Once I more closely approach the radioactive seed I then removed a circular portion of breast tissue sharply with the electrocautery around the radioactive seed while checking the area of radioactivity frequently.  Once the tissue was removed it was oriented with the appropriate painkillers.  A specimen radiograph was obtained that showed the clip and seed to be near the center of the specimen.  I elected to take an additional margin from each of the 6 margins.  These were marked appropriately.  All of the tissue was then sent to  pathology for further evaluation.  Hemostasis was achieved using the Bovie electrocautery.  The wound was irrigated with saline and infiltrated with more quarter percent Marcaine.  The cavity was marked with clips.  The deep layer of the incision was then closed with layers of interrupted 3-0 Vicryl stitches.  The skin was closed with interrupted 4-0 Monocryl subcuticular stitches.  Dermabond dressings were applied.   The patient tolerated the procedure well.  At the end of the case all needle sponge and instrument counts were correct.  Patient was then awakened and taken recovery in stable condition.  PLAN OF CARE: Discharge to home after PACU  PATIENT DISPOSITION:  PACU - hemodynamically stable.   Delay start of Pharmacological VTE agent (>24hrs) due to surgical blood loss or risk of bleeding: not applicable

## 2024-07-18 NOTE — Anesthesia Postprocedure Evaluation (Signed)
 Anesthesia Post Note  Patient: Rebecca Huber  Procedure(s) Performed: BREAST LUMPECTOMY WITH RADIOACTIVE SEED AND SENTINEL LYMPH NODE BIOPSY (Left: Breast)     Patient location during evaluation: PACU Anesthesia Type: General and Regional Level of consciousness: awake and alert Pain management: pain level controlled Vital Signs Assessment: post-procedure vital signs reviewed and stable Respiratory status: spontaneous breathing, nonlabored ventilation and respiratory function stable Cardiovascular status: blood pressure returned to baseline and stable Postop Assessment: no apparent nausea or vomiting Anesthetic complications: no   No notable events documented.  Last Vitals:  Vitals:   07/18/24 1015 07/18/24 1056  BP: 120/61 113/68  Pulse: 78 77  Resp: 13 18  Temp:  (!) 36.4 C  SpO2: 97% 99%    Last Pain:  Vitals:   07/18/24 1056  TempSrc: Temporal  PainSc:                  Victorine Mcnee,W. EDMOND

## 2024-07-18 NOTE — Progress Notes (Signed)
Assisted Dr. Autumn Patty with left, pectoralis, ultrasound guided block. Side rails up, monitors on throughout procedure. See vital signs in flow sheet. Tolerated Procedure well.

## 2024-07-18 NOTE — Anesthesia Procedure Notes (Signed)
 Anesthesia Regional Block: Pectoralis block   Pre-Anesthetic Checklist: , timeout performed,  Correct Patient, Correct Site, Correct Laterality,  Correct Procedure, Correct Position, site marked,  Risks and benefits discussed,  Pre-op evaluation,  At surgeon's request and post-op pain management  Laterality: Left  Prep: Maximum Sterile Barrier Precautions used, chloraprep       Needles:  Injection technique: Single-shot  Needle Type: Echogenic Stimulator Needle     Needle Length: 9cm  Needle Gauge: 21     Additional Needles:   Procedures:,,,, ultrasound used (permanent image in chart),,    Narrative:  Start time: 07/18/2024 7:18 AM End time: 07/18/2024 7:28 AM Injection made incrementally with aspirations every 5 mL.  Performed by: Personally  Anesthesiologist: Epifanio Fallow, MD

## 2024-07-18 NOTE — Discharge Instructions (Signed)
 Post Anesthesia Home Care Instructions  Activity: Get plenty of rest for the remainder of the day. A responsible individual must stay with you for 24 hours following the procedure.  For the next 24 hours, DO NOT: -Drive a car -Advertising copywriter -Drink alcoholic beverages -Take any medication unless instructed by your physician -Make any legal decisions or sign important papers.  Meals: Start with liquid foods such as gelatin or soup. Progress to regular foods as tolerated. Avoid greasy, spicy, heavy foods. If nausea and/or vomiting occur, drink only clear liquids until the nausea and/or vomiting subsides. Call your physician if vomiting continues.  Special Instructions/Symptoms: Your throat may feel dry or sore from the anesthesia or the breathing tube placed in your throat during surgery. If this causes discomfort, gargle with warm salt water. The discomfort should disappear within 24 hours.  If you had a scopolamine patch placed behind your ear for the management of post- operative nausea and/or vomiting:  1. The medication in the patch is effective for 72 hours, after which it should be removed.  Wrap patch in a tissue and discard in the trash. Wash hands thoroughly with soap and water. 2. You may remove the patch earlier than 72 hours if you experience unpleasant side effects which may include dry mouth, dizziness or visual disturbances. 3. Avoid touching the patch. Wash your hands with soap and water after contact with the patch.  Information for Discharge Teaching: EXPAREL (bupivacaine liposome injectable suspension)   Pain relief is important to your recovery. The goal is to control your pain so you can move easier and return to your normal activities as soon as possible after your procedure. Your physician may use several types of medicines to manage pain, swelling, and more.  Your surgeon or anesthesiologist gave you EXPAREL(bupivacaine) to help control your pain after  surgery.  EXPAREL is a local anesthetic designed to release slowly over an extended period of time to provide pain relief by numbing the tissue around the surgical site. EXPAREL is designed to release pain medication over time and can control pain for up to 72 hours. Depending on how you respond to EXPAREL, you may require less pain medication during your recovery. EXPAREL can help reduce or eliminate the need for opioids during the first few days after surgery when pain relief is needed the most. EXPAREL is not an opioid and is not addictive. It does not cause sleepiness or sedation.   Important! A teal colored band has been placed on your arm with the date, time and amount of EXPAREL you have received. Please leave this armband in place for the full 96 hours following administration, and then you may remove the band. If you return to the hospital for any reason within 96 hours following the administration of EXPAREL, the armband provides important information that your health care providers to know, and alerts them that you have received this anesthetic.    Possible side effects of EXPAREL: Temporary loss of sensation or ability to move in the area where medication was injected. Nausea, vomiting, constipation Rarely, numbness and tingling in your mouth or lips, lightheadedness, or anxiety may occur. Call your doctor right away if you think you may be experiencing any of these sensations, or if you have other questions regarding possible side effects.  Follow all other discharge instructions given to you by your surgeon or nurse. Eat a healthy diet and drink plenty of water or other fluids.  No tylenol until after 1pm today.

## 2024-07-18 NOTE — H&P (Signed)
 REFERRING PHYSICIAN: Gudena, Vinay K, MD PROVIDER: DEWARD GARNETTE NULL, MD MRN: [id number] DOB: [birth year] Subjective   Chief Complaint: Breast Cancer  History of Present Illness: Rebecca Huber is a 63 y.o. female who is seen today as an office consultation for evaluation of Breast Cancer  We are asked to see the patient in consultation by Dr. Odean to evaluate her for a new left breast cancer. The patient is a 63 year old black female who recently went for a routine screening mammogram. At that time she was found to have a 7 mm distortion in the lower inner quadrant of the left breast. The axilla looked normal. 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 She is otherwise in good health except for some hypertension. She does not smoke.  Review of Systems: A complete review of systems was obtained from the patient. I have reviewed this information and discussed as appropriate with the patient. See HPI as well for other ROS.  ROS   Medical History: Past Medical History:  Diagnosis Date  History of cancer  Hypertension   Patient Active Problem List  Diagnosis  Malignant neoplasm of lower-inner quadrant of left breast in female, estrogen receptor positive (CMS/HHS-HCC)   Past Surgical History:  Procedure Laterality Date  PERCUTANEOUS BIOPSY BREAST    No Known Allergies  Current Outpatient Medications on File Prior to Visit  Medication Sig Dispense Refill  amLODIPine  (NORVASC ) 10 MG tablet Take one tablet daily by mouth  atorvastatin  (LIPITOR) 10 MG tablet Take 10 mg by mouth once daily  hydroCHLOROthiazide  (HYDRODIURIL ) 25 MG tablet Take 25 mg by mouth once daily  losartan  (COZAAR ) 100 MG tablet Take 100 mg by mouth once daily   No current facility-administered medications on file prior to visit.   Family History  Problem Relation Age of Onset  High blood pressure (Hypertension)  Mother    Social History   Tobacco Use  Smoking Status Never  Smokeless Tobacco Never    Social History   Socioeconomic History  Marital status: Married  Tobacco Use  Smoking status: Never  Smokeless tobacco: Never   Social Drivers of Architectural technologist Insecurity: No Food Insecurity (07/07/2023)  Received from [facility]  Hunger Vital Sign  Within the past 12 months, you worried that your food would run out before you got the money to buy more.: Never true  Within the past 12 months, the food you bought just didn't last and you didn't have money to get more.: Never true  Transportation Needs: No Transportation Needs (07/07/2023)  [facility] - Transportation  Lack of Transportation (Medical): No  Lack of Transportation (Non-Medical): No  Physical Activity: Sufficiently Active (07/07/2023)  Received from [facility]  Exercise Vital Sign  On average, how many days per week do you engage in moderate to strenuous exercise (like a brisk walk)?: 5 days  On average, how many minutes do you engage in exercise at this level?: 150+ min   Objective:  There were no vitals filed for this visit.  There is no height or weight on file to calculate BMI.  Physical Exam Vitals reviewed.  Constitutional:  General: She is not in acute distress. Appearance: Normal appearance.  HENT:  Head: Normocephalic and atraumatic.  Right Ear: External ear normal.  Left Ear: External ear normal.  Nose: Nose normal.  Mouth/Throat:  Mouth: Mucous membranes  are moist.  Pharynx: Oropharynx is clear.  Eyes:  General: No scleral icterus. Extraocular Movements: Extraocular movements intact.  Conjunctiva/sclera: Conjunctivae normal.  Pupils: Pupils are equal, round, and reactive to light.  Cardiovascular:  Rate and Rhythm: Normal rate and regular rhythm.  Pulses: Normal pulses.  Heart sounds: Normal heart sounds.  Pulmonary:  Effort: Pulmonary effort is normal. No respiratory  distress.  Breath sounds: Normal breath sounds.  Abdominal:  General: Bowel sounds are normal.  Palpations: Abdomen is soft.  Tenderness: There is no abdominal tenderness.  Musculoskeletal:  General: No swelling, tenderness or deformity. Normal range of motion.  Cervical back: Normal range of motion and neck supple.  Skin: General: Skin is warm and dry.  Coloration: Skin is not jaundiced.  Neurological:  General: No focal deficit present.  Mental Status: She is alert and oriented to person, place, and time.  Psychiatric:  Mood and Affect: Mood normal.  Behavior: Behavior normal.     Breast: There is no palpable mass in either breast. There is no palpable axillary, supraclavicular, or cervical lymphadenopathy.  Labs, Imaging and Diagnostic Testing:  Assessment and Plan:   Diagnoses and all orders for this visit:  Malignant neoplasm of lower-inner quadrant of left breast in female, estrogen receptor positive (CMS/HHS-HCC) - [facility] Case Posting Request; Future   The patient appears to have a 7 mm lobular cancer in the lower inner quadrant of the left breast with clinically negative nodes and all favorable markers. I have discussed with her in detail the different options for treatment and at this point she favors breast conservation which I feel is very reasonable. Given the lobular nature of the cancer we will plan to do evaluate her with MRI prior to surgery to make sure we have an accurate size estimate. She will also be a good candidate for sentinel node biopsy as well. I have discussed with her in detail the risks and benefits of the operation as well as some of the technical aspects including use of a radioactive seed for localization and she understands and wishes to proceed. She will meet with medical and radiation oncology to discuss adjuvant therapy. We will move forward with surgical scheduling and call her with the results of the MRI

## 2024-07-18 NOTE — Anesthesia Procedure Notes (Signed)
 Procedure Name: LMA Insertion Date/Time: 07/18/2024 8:33 AM  Performed by: Frost Kayla MATSU, CRNAPre-anesthesia Checklist: Patient identified, Emergency Drugs available, Suction available and Patient being monitored Patient Re-evaluated:Patient Re-evaluated prior to induction Oxygen Delivery Method: Circle system utilized Preoxygenation: Pre-oxygenation with 100% oxygen Induction Type: IV induction Ventilation: Mask ventilation without difficulty LMA: LMA inserted LMA Size: 4.0 Number of attempts: 1 Placement Confirmation: positive ETCO2 Tube secured with: Tape Dental Injury: Teeth and Oropharynx as per pre-operative assessment

## 2024-07-18 NOTE — Interval H&P Note (Signed)
 History and Physical Interval Note:  07/18/2024 7:10 AM  Rebecca Huber  has presented today for surgery, with the diagnosis of LEFT BREAST CANCER.  The various methods of treatment have been discussed with the patient and family. After consideration of risks, benefits and other options for treatment, the patient has consented to  Procedure(s) with comments: BREAST LUMPECTOMY WITH RADIOACTIVE SEED AND SENTINEL LYMPH NODE BIOPSY (Left) - GEN w/PEC BLOCK LEFT BREAST RADIOACTIVE SEED LOCALIZED LUMPECTOMY SENTINEL NODE BIOPSY as a surgical intervention.  The patient's history has been reviewed, patient examined, no change in status, stable for surgery.  I have reviewed the patient's chart and labs.  Questions were answered to the patient's satisfaction.     Deward Null III

## 2024-07-18 NOTE — Anesthesia Preprocedure Evaluation (Addendum)
 Anesthesia Evaluation  Patient identified by MRN, date of birth, ID band Patient awake    Reviewed: Allergy & Precautions, H&P , NPO status , Patient's Chart, lab work & pertinent test results  Airway Mallampati: II  TM Distance: >3 FB Neck ROM: Full    Dental no notable dental hx. (+) Partial Upper, Dental Advisory Given   Pulmonary neg pulmonary ROS   Pulmonary exam normal breath sounds clear to auscultation       Cardiovascular hypertension, Pt. on medications  Rhythm:Regular Rate:Normal     Neuro/Psych negative neurological ROS  negative psych ROS   GI/Hepatic negative GI ROS, Neg liver ROS,,,  Endo/Other  negative endocrine ROS    Renal/GU Renal InsufficiencyRenal disease  negative genitourinary   Musculoskeletal   Abdominal   Peds  Hematology negative hematology ROS (+)   Anesthesia Other Findings   Reproductive/Obstetrics negative OB ROS                              Anesthesia Physical Anesthesia Plan  ASA: 2  Anesthesia Plan: General   Post-op Pain Management: Regional block* and Tylenol PO (pre-op)*   Induction: Intravenous  PONV Risk Score and Plan: 4 or greater and Ondansetron, Dexamethasone, Propofol infusion, TIVA and Midazolam  Airway Management Planned: LMA  Additional Equipment:   Intra-op Plan:   Post-operative Plan: Extubation in OR  Informed Consent: I have reviewed the patients History and Physical, chart, labs and discussed the procedure including the risks, benefits and alternatives for the proposed anesthesia with the patient or authorized representative who has indicated his/her understanding and acceptance.     Dental advisory given  Plan Discussed with: CRNA  Anesthesia Plan Comments:          Anesthesia Quick Evaluation

## 2024-07-19 ENCOUNTER — Encounter (HOSPITAL_BASED_OUTPATIENT_CLINIC_OR_DEPARTMENT_OTHER): Payer: Self-pay | Admitting: General Surgery

## 2024-07-20 ENCOUNTER — Other Ambulatory Visit

## 2024-07-23 ENCOUNTER — Inpatient Hospital Stay: Admitting: Hematology and Oncology

## 2024-07-23 LAB — SURGICAL PATHOLOGY

## 2024-07-24 ENCOUNTER — Encounter: Payer: Self-pay | Admitting: *Deleted

## 2024-07-24 ENCOUNTER — Telehealth: Payer: Self-pay | Admitting: *Deleted

## 2024-07-24 NOTE — Telephone Encounter (Signed)
 Ordered oncotype per Dr. Pamelia Hoit. Sent requisition to pathology and exact sciences.

## 2024-07-26 ENCOUNTER — Ambulatory Visit: Payer: Self-pay | Admitting: General Surgery

## 2024-08-01 ENCOUNTER — Encounter (HOSPITAL_COMMUNITY): Payer: Self-pay

## 2024-08-01 ENCOUNTER — Other Ambulatory Visit (INDEPENDENT_AMBULATORY_CARE_PROVIDER_SITE_OTHER): Payer: Self-pay | Admitting: Primary Care

## 2024-08-01 ENCOUNTER — Encounter: Payer: Self-pay | Admitting: *Deleted

## 2024-08-01 ENCOUNTER — Telehealth: Payer: Self-pay | Admitting: *Deleted

## 2024-08-01 DIAGNOSIS — I10 Essential (primary) hypertension: Secondary | ICD-10-CM

## 2024-08-01 DIAGNOSIS — Z760 Encounter for issue of repeat prescription: Secondary | ICD-10-CM

## 2024-08-01 DIAGNOSIS — C50312 Malignant neoplasm of lower-inner quadrant of left female breast: Secondary | ICD-10-CM

## 2024-08-01 NOTE — Therapy (Unsigned)
 OUTPATIENT PHYSICAL THERAPY BREAST CANCER POST OP FOLLOW UP   Patient Name: Rebecca Huber MRN: [id number] DOB:1961/08/28, 63 y.o., female Today's Date: 08/02/2024  END OF SESSION:  PT End of Session - 08/02/24 1111     Visit Number 2    Number of Visits 2    PT Start Time 1104    PT Stop Time 1145    PT Time Calculation (min) 41 min    Activity Tolerance Patient tolerated treatment well    Behavior During Therapy [facility] for tasks assessed/performed          Past Medical History:  Diagnosis Date   Cancer (HCC) 06/2024   right breast ILC   Hyperlipidemia    Hypertension    Past Surgical History:  Procedure Laterality Date   BREAST BIOPSY Left 06/01/2024   US  LT BREAST BX W LOC DEV 1ST LESION IMG BX SPEC US  GUIDE 06/01/2024 [facility]   BREAST BIOPSY  07/17/2024   MM LT RADIOACTIVE SEED LOC MAMMO GUIDE 07/17/2024 [facility]   BREAST LUMPECTOMY WITH RADIOACTIVE SEED AND SENTINEL LYMPH NODE BIOPSY Left 07/18/2024   Procedure: BREAST LUMPECTOMY WITH RADIOACTIVE SEED AND SENTINEL LYMPH NODE BIOPSY;  Surgeon: Rebecca Deward MOULD, MD;  Location: [facility];  Service: General;  Laterality: Left;  GEN w/PEC BLOCK LEFT BREAST RADIOACTIVE SEED LOCALIZED LUMPECTOMY SENTINEL NODE BIOPSY   Patient Active Problem List   Diagnosis Date Noted   Genetic testing 06/25/2024   Malignant neoplasm of lower-inner quadrant of left breast in female, estrogen receptor positive (HCC) 06/08/2024   Salmonella enteritis 09/07/2019   Enteropathogenic Escherichia coli infection 09/07/2019   AKI (acute kidney injury) 09/05/2019    REFERRING PROVIDER: Dr. Deward Rebecca  REFERRING DIAG: Left breast cancer  THERAPY DIAG:  Malignant neoplasm of lower-inner quadrant of left breast in female, estrogen receptor positive (HCC)  Abnormal posture  Aftercare following surgery for neoplasm  Rationale for Evaluation and Treatment: Rehabilitation  ONSET DATE: 07/18/2024  SUBJECTIVE:                                                                                                                                                                                            SUBJECTIVE STATEMENT: Patient reports she underwent a left lumpectomy and sentinel node biopsy (6 negative nodes) on 07/18/2024. Her Oncotype score was 10 so no chemotherapy is needed and she will proceed to radiation followed by anti-estrogen therapy.  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 She underwent a left lumpectomy and sentinel node biopsy (6 negative nodes) on 07/18/2024. It  is ER/PR positive and HER2 negative with a Ki67 of 5%.   PATIENT GOALS:  Reassess how my recovery is going related to arm function, pain, and swelling.  PAIN:  Are you having pain? No  PRECAUTIONS: Recent Surgery, left UE Lymphedema risk  RED FLAGS: None   ACTIVITY LEVEL / LEISURE: She has returned to work at [employer] but she isn't returned to vacuuming, emptying big bags of trash   OBJECTIVE:   PATIENT SURVEYS:  QUICK DASH:  Rebecca Huber - 08/02/24 0001     Open a tight or new jar No difficulty    Do heavy household chores (wash walls, wash floors) No difficulty    Carry a shopping bag or briefcase No difficulty    Wash your back No difficulty    Use a knife to cut food No difficulty    Recreational activities in which you take some force or impact through your arm, shoulder, or hand (golf, hammering, tennis) No difficulty    During the past week, to what extent has your arm, shoulder or hand problem interfered with your normal social activities with family, friends, neighbors, or groups? Not at all    During the past week, to what extent has your arm, shoulder or hand problem limited your work or other regular daily activities Not at all    Arm, shoulder, or hand pain. None    Tingling (pins and needles) in your arm, shoulder, or hand None     Difficulty Sleeping No difficulty    DASH Score 0 %           OBSERVATIONS: Left breast and axillary incisions both appear to be well healed with no sign of redness, edema, or warmth. She is healing well. No c/o tenderness to palpation at incision sites. Scar tissue with mild tightness.  POSTURE:  Forward head, rounded shoulders  LYMPHEDEMA ASSESSMENT:   UPPER EXTREMITY AROM/PROM:   A/PROM RIGHT   eval    Shoulder extension 63  Shoulder flexion 158  Shoulder abduction 157  Shoulder internal rotation 62  Shoulder external rotation 72                          (Blank rows = not tested)   A/PROM LEFT   eval LEFT 08/02/2024  Shoulder extension 57 49  Shoulder flexion 141 134  Shoulder abduction 162 159  Shoulder internal rotation 71 70  Shoulder external rotation 80 85                          (Blank rows = not tested)   CERVICAL AROM: All within normal limits   UPPER EXTREMITY STRENGTH: WNL   LYMPHEDEMA ASSESSMENTS (in cm):    LANDMARK RIGHT   eval RIGHT 08/02/2024  10 cm proximal to olecranon process 35.2 33.9  Olecranon process 30.2 29.3  10 cm proximal to ulnar styloid process 23.8 22.3  Just proximal to ulnar styloid process 17.7 17.2  Across hand at thumb web space 20.4 19.6  At base of 2nd digit 6.7 6.6  (Blank rows = not tested)   LANDMARK LEFT   eval LEFT 08/02/2024  10 cm proximal to olecranon process 36.5 34.4  Olecranon process 30.3 29.6  10 cm proximal to ulnar styloid process 23 22.7  Just proximal to ulnar styloid process 18.2 17.7  Across hand at thumb web space 20.2 20  At base of 2nd digit 6.7  6.5  (Blank rows = not tested)    Surgery type/Date: eft lumpectomy and sentinel node biopsy 07/18/2024 Number of lymph nodes removed: 6 Current/past treatment (chemo, radiation, hormone therapy): none Other symptoms:  Heaviness/tightness No Pain No Pitting edema No Infections No Decreased scar mobility Yes Stemmer sign No  PATIENT  EDUCATION:  Education details: Reviewed HEP; educated to and her son on lymphedema risk reduction practices and scar massage Person educated: Patient and Child(ren) Education method: Explanation, Demonstration, Tactile cues, Verbal cues, and Handouts Education comprehension: verbalized understanding and returned demonstration  HOME EXERCISE PROGRAM: Reviewed previously given post op HEP. Encouraged her to do bil AAROM shoulder flexion and ER in supine (1st and 4th exercises on HEP sheet)   ASSESSMENT:  CLINICAL IMPRESSION: Patient is doing incredibly well s/p left lumpectomy and sentinel node biopsy that was only 2 weeks ago. She has regained full shoulder ROM, reports no pain, incisions appear to be healing very well, and she demonstrates no sign of edema or other deficits. She will benefit from returning for regular SOZO screens to detect any early sign of lymphedema but otherwise has no need for PT at this time.  Pt will benefit from skilled therapeutic intervention to improve on the following deficits: Decreased knowledge of precautions, impaired UE functional use, pain, decreased ROM, postural dysfunction.   PT treatment/interventions: ADL/Self care home management, [phone]- PT Re-evaluation, 97110-Therapeutic exercises, 97530- Therapeutic activity, 97535- Self Care, Patient/Family education, and Scar mobilization   GOALS: Goals reviewed with patient? Yes  GOALS MET AT EVAL:  GOALS Name Target Date Goal status  1 Pt will be able to verbalize understanding of pertinent lymphedema risk reduction practices relevant to her dx specifically related to skin care.  Baseline:  No knowledge Eval Achieved at eval  2 Pt will be able to return demo and/or verbalize understanding of the post op HEP related to regaining shoulder ROM. Baseline:  No knowledge Eval Achieved at eval  3 Pt will be able to verbalize understanding of the importance of viewing the post op After Breast CA Class video for  further lymphedema risk reduction education and therapeutic exercise.  Baseline:  No knowledge Eval Achieved at eval   LONG TERM GOALS:  (STG=LTG)  GOALS Name Target Date  Goal status  1 Pt will demonstrate she has regained full shoulder ROM and function post operatively compared to baselines.  Baseline: 08/08/2024 MET     PLAN:  PT FREQUENCY/DURATION: N/A  PLAN FOR NEXT SESSION: N/A   [facility]  [address], [address]  [place] [address]  [phone]  After Breast Cancer Class Video It is recommended you view the ABC class video to be educated on lymphedema risk reduction. This video lasts for about 30 minutes. It can be viewed on our website here: [email or web address]  Scar massage You can begin gentle scar massage to you incision sites. Gently place one hand on the incision and move the skin (without sliding on the skin) in various directions. Do this for a few minutes and then you can gently massage either coconut oil or vitamin E cream into the scars.  Compression garment You should continue wearing your compression bra until you feel like you no longer have swelling.  Home exercise Program Continue doing the exercises you were given until you feel like you can do them without feeling any tightness at the end.   Walking Program Studies show that 30 minutes of walking per day (fast enough to elevate your  heart rate) can significantly reduce the risk of a cancer recurrence. If you can't walk due to other medical reasons, we encourage you to find another activity you could do (like a stationary bike or water exercise).  Posture After breast cancer surgery, people frequently sit with rounded shoulders posture because it puts their incisions on slack and feels better. If you sit like this and scar tissue forms in that position, you can become very tight and have pain sitting or  standing with good posture. Try to be aware of your posture and sit and stand up tall to heal properly.  Follow up PT: It is recommended you return every 3 months for the first 3 years following surgery to be assessed on the SOZO machine for an L-Dex score. This helps prevent clinically significant lymphedema in 95% of patients. These follow up screens are 10 minute appointments that you are not billed for.  PHYSICAL THERAPY DISCHARGE SUMMARY  Visits from Start of Care: 2  Current functional level related to goals / functional outcomes: Goals met; see above for objective findings   Remaining deficits: None   Education / Equipment: HEP; lymphedema risk reduction; scar massage   Patient agrees to discharge. Patient goals were met. Patient is being discharged due to meeting the stated rehab goals.  Eward Wonda Sharps, [place] 08/02/24 12:01 PM

## 2024-08-01 NOTE — Telephone Encounter (Signed)
 Received oncotype results of 10/5%.  Patient is aware. Referral placed for Dr. Dewey.

## 2024-08-02 ENCOUNTER — Encounter: Payer: Self-pay | Admitting: Physical Therapy

## 2024-08-02 ENCOUNTER — Ambulatory Visit: Attending: General Surgery | Admitting: Physical Therapy

## 2024-08-02 DIAGNOSIS — R293 Abnormal posture: Secondary | ICD-10-CM | POA: Diagnosis present

## 2024-08-02 DIAGNOSIS — Z483 Aftercare following surgery for neoplasm: Secondary | ICD-10-CM | POA: Diagnosis present

## 2024-08-02 DIAGNOSIS — Z170 Estrogen receptor positive status [ER+]: Secondary | ICD-10-CM | POA: Diagnosis present

## 2024-08-02 DIAGNOSIS — C50312 Malignant neoplasm of lower-inner quadrant of left female breast: Secondary | ICD-10-CM | POA: Diagnosis present

## 2024-08-02 NOTE — Patient Instructions (Signed)
 [facility]  [address], [address]  [place] [address]  [phone]  After Breast Cancer Class Video It is recommended you view the ABC class video to be educated on lymphedema risk reduction. This video lasts for about 30 minutes. It can be viewed on our website here: [email or web address]  Scar massage You can begin gentle scar massage to you incision sites. Gently place one hand on the incision and move the skin (without sliding on the skin) in various directions. Do this for a few minutes and then you can gently massage either coconut oil or vitamin E cream into the scars.  Compression garment You should continue wearing your compression bra until you feel like you no longer have swelling.  Home exercise Program Continue doing the exercises you were given until you feel like you can do them without feeling any tightness at the end.   Walking Program Studies show that 30 minutes of walking per day (fast enough to elevate your heart rate) can significantly reduce the risk of a cancer recurrence. If you can't walk due to other medical reasons, we encourage you to find another activity you could do (like a stationary bike or water exercise).  Posture After breast cancer surgery, people frequently sit with rounded shoulders posture because it puts their incisions on slack and feels better. If you sit like this and scar tissue forms in that position, you can become very tight and have pain sitting or standing with good posture. Try to be aware of your posture and sit and stand up tall to heal properly.  Follow up PT: It is recommended you return every 3 months for the first 3 years following surgery to be assessed on the SOZO machine for an L-Dex score. This helps prevent clinically significant lymphedema in 95% of patients. These follow up screens are 10 minute appointments that you are not billed  for.

## 2024-08-02 NOTE — Telephone Encounter (Signed)
 Will forward to provider

## 2024-08-06 ENCOUNTER — Ambulatory Visit (INDEPENDENT_AMBULATORY_CARE_PROVIDER_SITE_OTHER): Admitting: Primary Care

## 2024-08-06 ENCOUNTER — Encounter (INDEPENDENT_AMBULATORY_CARE_PROVIDER_SITE_OTHER): Payer: Self-pay | Admitting: Primary Care

## 2024-08-06 ENCOUNTER — Other Ambulatory Visit (HOSPITAL_COMMUNITY)
Admission: RE | Admit: 2024-08-06 | Discharge: 2024-08-06 | Disposition: A | Discharge status: Home / Self Care | Source: Ambulatory Visit | Attending: Primary Care | Admitting: Primary Care

## 2024-08-06 VITALS — BP 131/77 | HR 85 | Resp 16 | Wt 196.0 lb

## 2024-08-06 DIAGNOSIS — I10 Essential (primary) hypertension: Secondary | ICD-10-CM

## 2024-08-06 DIAGNOSIS — E7800 Pure hypercholesterolemia, unspecified: Secondary | ICD-10-CM | POA: Diagnosis not present

## 2024-08-06 DIAGNOSIS — Z124 Encounter for screening for malignant neoplasm of cervix: Secondary | ICD-10-CM

## 2024-08-06 DIAGNOSIS — Z01411 Encounter for gynecological examination (general) (routine) with abnormal findings: Secondary | ICD-10-CM | POA: Insufficient documentation

## 2024-08-06 DIAGNOSIS — Z760 Encounter for issue of repeat prescription: Secondary | ICD-10-CM

## 2024-08-06 DIAGNOSIS — R102 Pelvic and perineal pain unspecified side: Secondary | ICD-10-CM | POA: Insufficient documentation

## 2024-08-06 DIAGNOSIS — Z1151 Encounter for screening for human papillomavirus (HPV): Secondary | ICD-10-CM | POA: Diagnosis not present

## 2024-08-06 MED ORDER — ATORVASTATIN CALCIUM 10 MG PO TABS: 10.0000 mg | ORAL_TABLET | Freq: Every day | ORAL | 3 refills | Status: AC

## 2024-08-06 MED ORDER — HYDROCHLOROTHIAZIDE 25 MG PO TABS: 25.0000 mg | ORAL_TABLET | Freq: Every day | ORAL | 1 refills | Status: AC

## 2024-08-06 MED ORDER — LOSARTAN POTASSIUM 100 MG PO TABS: 100.0000 mg | ORAL_TABLET | Freq: Every day | ORAL | 1 refills | Status: AC

## 2024-08-06 NOTE — Progress Notes (Signed)
 [facility]  WELL-WOMAN PHYSICAL & PAP Patient name: Rebecca Huber MRN [id number]  Date of birth: 06/08/1961 Chief Complaint:   Gynecologic Exam  History of Present Illness:   Rebecca Huber is a 63 y.o. No obstetric history on file. female being seen today for a routine well-woman exam.   [id number] HUNTLEY DEMEDEIROS requested delay all health maintenance that is currently due at this time.  Until she is due with this breast cancer with radiation .  The current method of family planning is none.  No LMP recorded. Patient is postmenopausal. Last pap 2021. Results were: Nnormal Last mammogram: 08/06/24. Results were: Family h/o breast cancer: No Last colonoscopy: unable to follow cologuard . Instructions. Family h/o colorectal cancer: No  Health Maintenance  Topic Date Due   Medicare Annual Wellness Visit  Never done   Zoster (Shingles) Vaccine (1 of 2) Never done   Colon Cancer Screening  Never done   Pneumococcal Vaccine for age over 73 (1 of 1 - PCV) Never done   COVID-19 Vaccine (3 - Mixed Product risk series) 07/04/2020   Stool Blood Test  12/19/2020   Pap with HPV screening  12/19/2022   DEXA scan (bone density measurement)  Never done   Flu Shot  Never done   Breast Cancer Screening  06/21/2025   DTaP/Tdap/Td vaccine (2 - Td or Tdap) 11/22/2029   Hepatitis C Screening  Completed   HIV Screening  Completed   Hepatitis B Vaccine  Aged Out   Meningitis B Vaccine  Aged Out   Review of Systems:    Denies any headaches, blurred vision, fatigue, shortness of breath, chest pain, abdominal pain, abnormal vaginal discharge/itching/odor/irritation, problems with periods, bowel movements, urination, or intercourse unless otherwise stated above.  Pertinent History Reviewed:   Reviewed past medical,surgical, social and family history.  Reviewed problem list, medications and allergies.  History [name] has a past medical history of Cancer (HCC) (06/2024), Hyperlipidemia,  and Hypertension.   She has a past surgical history that includes Breast biopsy (Left, 06/01/2024); Breast biopsy (07/17/2024); and Breast lumpectomy with radioactive seed and sentinel lymph node biopsy (Left, 07/18/2024).   Her family history includes Lung cancer (age of onset: 38) in her mother.She reports that she has never smoked. She has never used smokeless tobacco. She reports current alcohol use of about 1.0 standard drink of alcohol per week. She reports that she does not use drugs.  Current Outpatient Medications on File Prior to Visit  Medication Sig Dispense Refill   amLODipine  (NORVASC ) 10 MG tablet Take one tablet daily by mouth 90 tablet 1   atorvastatin  (LIPITOR) 10 MG tablet Take 1 tablet (10 mg total) by mouth daily. 90 tablet 3   hydrochlorothiazide  (HYDRODIURIL ) 25 MG tablet Take 1 tablet (25 mg total) by mouth daily. 90 tablet 3   losartan  (COZAAR ) 100 MG tablet Take 1 tablet (100 mg total) by mouth daily. 90 tablet 1   oxyCODONE (ROXICODONE) 5 MG immediate release tablet Take 1 tablet (5 mg total) by mouth every 6 (six) hours as needed. 10 tablet 0   No current facility-administered medications on file prior to visit.    Physical Assessment:   Vitals:   08/06/24 1353  BP: 131/77  Pulse: 85  Resp: 16  SpO2: 99%  Weight: 196 lb (88.9 kg)  Body mass index is 30.7 kg/m.        Physical Examination:  General appearance - well appearing, and in no distress Mental status -  alert, oriented to person, place, and time Psych:  She has a normal mood and affect Skin - warm and dry, normal color, no suspicious lesions noted Chest - effort normal, all lung fields clear to auscultation bilaterally Heart - normal rate and regular rhythm Neck:  midline trachea, no thyromegaly or nodules Breasts - mammogram completed Educated patient on proper self breast examination and had patient to demonstrate SBE. Abdomen - soft, nontender, nondistended, no masses or  organomegaly Pelvic-VULVA: normal appearing vulva with no masses, tenderness or lesions   VAGINA: normal appearing vagina with normal color and discharge, no lesions   CERVIX: normal appearing cervix without discharge or lesions, no CMT UTERUS: uterus is felt to be normal size, shape, consistency and nontender  ADNEXA: No adnexal masses or tenderness noted. Internal Hemorid  Extremities:  No swelling or varicosities noted  No results found for this or any previous visit (from the past 24 hours).   Assessment & Plan:  [name] was seen today for gynecologic exam.  Diagnoses and all orders for this visit:  Cervical cancer screening -     Cervicovaginal ancillary only -     Cytology - PAP  Medication refill -     atorvastatin  (LIPITOR) 10 MG tablet; Take 1 tablet (10 mg total) by mouth daily. -     hydrochlorothiazide  (HYDRODIURIL ) 25 MG tablet; Take 1 tablet (25 mg total) by mouth daily. -     losartan  (COZAAR ) 100 MG tablet; Take 1 tablet (100 mg total) by mouth daily.  Elevated LDL cholesterol level  Healthy lifestyle diet of fruits vegetables fish nuts whole grains and low saturated fat . Foods high in cholesterol or liver, fatty meats,cheese, butter avocados, nuts and seeds, chocolate and fried foods. Lipid panel  -     atorvastatin  (LIPITOR) 10 MG tablet; Take 1 tablet (10 mg total) by mouth daily.  Essential hypertension Well controlled  DIET: Limit salt intake, read nutrition labels to check salt content, limit fried and high fatty foods  Avoid using multisymptom OTC cold preparations that generally contain sudafed which can rise BP. Consult with pharmacist on best cold relief products to use for persons with HTN EXERCISE Discussed incorporating exercise such as walking - 30 minutes most days of the week and can do in 10 minute intervals    -     hydrochlorothiazide  (HYDRODIURIL ) 25 MG tablet; Take 1 tablet (25 mg total) by mouth daily. -     losartan  (COZAAR ) 100 MG tablet;  Take 1 tablet (100 mg total) by mouth daily.     This note has been created with Education officer, environmental. Any transcriptional errors are unintentional.   Rosaline SHAUNNA Bohr, NP 08/06/2024, 2:01 PM

## 2024-08-06 NOTE — Progress Notes (Signed)
 Location of Breast Cancer: left breast  Histology per Pathology Report:    Receptor Status: ER(pos), PR (pos), Her2-neu (neg), Ki-(5%)  Did patient present with symptoms (if so, please note symptoms) or was this found on screening mammography?: Found on screening mammogram  Past/Anticipated interventions by surgeon, if any:  BREAST BIOPSY Left 06/01/2024     US  LT BREAST BX W LOC DEV 1ST LESION IMG BX SPEC US  GUIDE    Past/Anticipated interventions by medical oncology, if any: None  Lymphedema issues, if any:  None  Pain issues, if any:  none  Skin issues if any : None  SAFETY ISSUES: Prior radiation? no Pacemaker/ICD? no Possible current pregnancy? No, postmenopausal Is the patient on methotrexate? no  Current Complaints / other details: None    Dyke JULIANNA Frost, LPN 89/72/7974,7:77 PM

## 2024-08-07 ENCOUNTER — Encounter: Payer: Self-pay | Admitting: *Deleted

## 2024-08-07 LAB — LIPID PANEL
Chol/HDL Ratio: 2.9 ratio (ref 0.0–4.4)
Cholesterol, Total: 168 mg/dL (ref 100–199)
HDL: 57 mg/dL (ref >39)
LDL Chol Calc (NIH): 99 mg/dL (ref 0–99)
Triglycerides: 63 mg/dL (ref 0–149)
VLDL Cholesterol Cal: 12 mg/dL (ref 5–40)

## 2024-08-08 LAB — CERVICOVAGINAL ANCILLARY ONLY
Bacterial Vaginitis (gardnerella): NEGATIVE
Candida Glabrata: NEGATIVE
Candida Vaginitis: NEGATIVE
Chlamydia: NEGATIVE
Comment: NEGATIVE
Comment: NEGATIVE
Comment: NEGATIVE
Comment: NEGATIVE
Comment: NEGATIVE
Comment: NORMAL
Neisseria Gonorrhea: NEGATIVE
Trichomonas: NEGATIVE

## 2024-08-09 LAB — CYTOLOGY - PAP
Comment: NEGATIVE
Diagnosis: NEGATIVE
Diagnosis: REACTIVE
High risk HPV: NEGATIVE

## 2024-08-09 NOTE — Progress Notes (Signed)
 PROVIDER:  DEWARD GARNETTE NULL, MD  MRN: [id number] DOB: Aug 13, 1961 DATE OF ENCOUNTER: 08/09/2024 Subjective     Chief Complaint: Post Operative Visit     History of Present Illness: Rebecca Huber is a 63 y.o. female who is seen today for left breast cancer.  The patient is a 63 year old black female who is about 3 weeks status post left breast lumpectomy and sentinel node biopsy for a T1b N0 invasive lobular cancer that was ER and PR positive and HER2 negative with a Ki-67 of 5%.  Her Oncotype score was 10 which puts her in the low risk category.  She tolerated the surgery well.  She denies any breast pain.  She does report a little numbness of the inside of the left upper arm     Review of Systems: A complete review of systems was obtained from the patient.  I have reviewed this information and discussed as appropriate with the patient.  See HPI as well for other ROS.  ROS    Medical History: Past Medical History:  Diagnosis Date  . History of cancer   . Hypertension     Patient Active Problem List  Diagnosis  . Malignant neoplasm of lower-inner quadrant of left breast in female, estrogen receptor positive (CMS/HHS-HCC)    Past Surgical History:  Procedure Laterality Date  . PERCUTANEOUS BIOPSY BREAST       No Known Allergies  Current Outpatient Medications on File Prior to Visit  Medication Sig Dispense Refill  . amLODIPine  (NORVASC ) 10 MG tablet Take one tablet daily by mouth    . atorvastatin  (LIPITOR) 10 MG tablet Take 10 mg by mouth once daily    . hydroCHLOROthiazide  (HYDRODIURIL ) 25 MG tablet Take 25 mg by mouth once daily    . losartan  (COZAAR ) 100 MG tablet Take 100 mg by mouth once daily     No current facility-administered medications on file prior to visit.    Family History  Problem Relation Age of Onset  . High blood pressure (Hypertension) Mother      Social History   Tobacco Use  Smoking Status Never  Smokeless Tobacco Never     Social  History   Socioeconomic History  . Marital status: Married  Tobacco Use  . Smoking status: Never  . Smokeless tobacco: Never  Substance and Sexual Activity  . Alcohol use: Yes    Alcohol/week: 4.0 - 14.0 standard drinks of alcohol    Types: 4 - 14 Standard drinks or equivalent per week  . Drug use: Never   Social Drivers of Health   Food Insecurity: No Food Insecurity (06/13/2024)   Received from [facility]   Hunger Vital Sign   . Within the past 12 months, you worried that your food would run out before you got the money to buy more.: Never true   . Within the past 12 months, the food you bought just didn't last and you didn't have money to get more.: Never true  Transportation Needs: No Transportation Needs (06/13/2024)   Received from [facility] - Transportation   . In the past 12 months, has lack of transportation kept you from medical appointments or from getting medications?: No   . In the past 12 months, has lack of transportation kept you from meetings, work, or from getting things needed for daily living?: No  Physical Activity: Sufficiently Active (07/07/2023)   Received from [facility]   Exercise Vital Sign   . On average,  how many days per week do you engage in moderate to strenuous exercise (like a brisk walk)?: 5 days   . On average, how many minutes do you engage in exercise at this level?: 150+ min  Housing Stability: Unknown (08/09/2024)   Housing Stability Vital Sign   . Homeless in the Last Year: No    Objective:    Vitals:   08/09/24 1424  BP: 127/75  Pulse: 105  Temp: 37.1 C (98.7 F)  SpO2: 98%  Weight: 89.8 kg (198 lb)  Height: 170.2 cm (5' 7)  PainSc: 0-No pain    Body mass index is 31.01 kg/m.  Physical Exam Vitals reviewed.  Constitutional:      General: She is not in acute distress.    Appearance: Normal appearance.  HENT:     Head: Normocephalic and atraumatic.     Right Ear: External ear normal.     Left Ear: External  ear normal.     Nose: Nose normal.     Mouth/Throat:     Mouth: Mucous membranes are moist.     Pharynx: Oropharynx is clear.  Eyes:     General: No scleral icterus.    Extraocular Movements: Extraocular movements intact.     Conjunctiva/sclera: Conjunctivae normal.     Pupils: Pupils are equal, round, and reactive to light.  Cardiovascular:     Rate and Rhythm: Normal rate and regular rhythm.     Pulses: Normal pulses.     Heart sounds: Normal heart sounds.  Pulmonary:     Effort: Pulmonary effort is normal. No respiratory distress.     Breath sounds: Normal breath sounds.  Abdominal:     General: Bowel sounds are normal.     Palpations: Abdomen is soft.     Tenderness: There is no abdominal tenderness.  Musculoskeletal:        General: No swelling, tenderness or deformity. Normal range of motion.     Cervical back: Normal range of motion and neck supple.  Skin:    General: Skin is warm and dry.     Coloration: Skin is not jaundiced.  Neurological:     General: No focal deficit present.     Mental Status: She is alert and oriented to person, place, and time.  Psychiatric:        Mood and Affect: Mood normal.        Behavior: Behavior normal.      Breast: The left breast periareolar and axillary incisions are healing nicely with no sign of infection or seroma.   Labs, Imaging and Diagnostic Testing:     Assessment and Plan:     Diagnoses and all orders for this visit:  Malignant neoplasm of lower-inner quadrant of left breast in female, estrogen receptor positive (CMS/HHS-HCC)     The patient is about 3 weeks status post left breast lumpectomy for breast cancer.  She tolerated the surgery well.  At this point she will follow-up with medical and radiation oncology for adjuvant therapy.  I will plan to see her back in about 6 months.  Return in about 6 months (around 02/07/2025).   DEWARD GARNETTE NULL, MD    I had direct face-to-face contact with the patient for a  total of 20 minutes and greater than 50% of that time was spent providing counseling and/or coordination of care for the patient regarding left breast cancer.

## 2024-08-13 ENCOUNTER — Inpatient Hospital Stay: Attending: Hematology and Oncology | Admitting: Hematology and Oncology

## 2024-08-13 ENCOUNTER — Ambulatory Visit (INDEPENDENT_AMBULATORY_CARE_PROVIDER_SITE_OTHER): Payer: Self-pay | Admitting: Primary Care

## 2024-08-13 NOTE — Assessment & Plan Note (Deleted)
 07/18/2024:Left lumpectomy: Grade 2 ILC with extensive LCIS 0.8 cm, margins negative, 0/6 lymph nodes negative, ER 95%, PR 100%, HER2 negative by FISH, Ki-67 5% Oncotype DX score 10 (5% risk of distant recurrence)  Pathology counseling: I discussed the final pathology report of the patient provided  a copy of this report. I discussed the margins as well as lymph node surgeries. We also discussed the final staging along with previously performed ER/PR and HER-2/neu testing.  Treatment plan: Adjuvant radiation therapy Followed by adjuvant antiestrogen therapy with anastrozole 1 mg daily x 5 to 7 years  Return to clinic after radiation is complete

## 2024-08-13 NOTE — Progress Notes (Signed)
 Radiation Oncology         ([phone]) [phone] ________________________________  Name: Rebecca Huber        MRN: [id number]  Date of Service: 08/16/2024 DOB: 1961-07-17  RR:Zitjmid, Rosaline SQUIBB, NP  Odean Potts, MD     REFERRING PHYSICIAN: Odean Potts, MD   DIAGNOSIS: The encounter diagnosis was Malignant neoplasm of lower-inner quadrant of left breast in female, estrogen receptor positive (HCC).   HISTORY OF PRESENT ILLNESS: Rebecca Huber is a 63 y.o. female originally seen in the multidisciplinary breast clinic for a new diagnosis of left breast cancer. The patient was noted to have a right breast asymmetry that was followed for several years and recent screening left distortion. She returned for diagnostic imaging on 05/30/24 and mammographically the right breast finding was stable and the left distortion persisted. By ultrasound the area in the left breast measured 7 mm in the 8:00 position. Her axilla was negative for adenopathy. 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 The tumor was ER/PR positive HER2 negative with a Ki 67 of 5%.   Since her last visit, she underwent an MRI of the breasts on 06/21/2024; this showed focal non-mass enhancement surrounding the biopsy clip in the left breast consistent with the site of her known cancer, another site of linear non-mass enhancement in the lower outer quadrant in the left breast was indeterminate and a second site of focal non-mass enhancement in the lower outer quadrant of the left breast was also indeterminate and MRI biopsies were recommended.  No abnormalities were seen in the right breast.  There was incomplete characterization of a T2 hyperintense signal lesion in the liver that was felt to likely be hemangioma but abdominal MRI was recommended.  She underwent an MRI guided biopsy on 06/28/2024 located in the lower posterior depth and benign breast tissue was seen with fibrocystic change  and no malignancy was appreciated in the specimen.  She was counseled on proceeding with lumpectomy and underwent this procedure along with sentinel lymph node biopsy on 07/18/2024.  Final pathology showed a grade 2 invasive lobular carcinoma that measured 8 mm in greatest dimension.  LCIS was also present.  All margins were negative the closest being 1 mm to the inferior margin.  6 sentinel lymph nodes were sampled, all of them were negative for disease.  She had an Oncotype DX score performed on her surgical specimen that showed recurrence risk score of 10.  This confirmed her specimen was ER/PR positive and HER2 negative.  Dr. Odean does not recommend chemotherapy based on this.  She is seen to consider adjuvant radiotherapy.  PREVIOUS RADIATION THERAPY: No   PAST MEDICAL HISTORY:  Past Medical History:  Diagnosis Date   Cancer (HCC) 06/2024   right breast ILC   Hyperlipidemia    Hypertension        PAST SURGICAL HISTORY: Past Surgical History:  Procedure Laterality Date   BREAST BIOPSY Left 06/01/2024   US  LT BREAST BX W LOC DEV 1ST LESION IMG BX SPEC US  GUIDE 06/01/2024 [facility]   BREAST BIOPSY  07/17/2024   MM LT RADIOACTIVE SEED LOC MAMMO GUIDE 07/17/2024 [facility]MO[facility]   BREAST LUMPECTOMY WITH RADIOACTIVE SEED AND SENTINEL LYMPH NODE BIOPSY Left 07/18/2024   Procedure: BREAST LUMPECTOMY WITH RADIOACTIVE SEED AND SENTINEL LYMPH NODE BIOPSY;  Surgeon: Curvin Deward MOULD, MD;  Location: [facility];  Service: General;  Laterality: Left;  GEN w/PEC  BLOCK LEFT BREAST RADIOACTIVE SEED LOCALIZED LUMPECTOMY SENTINEL NODE BIOPSY     FAMILY HISTORY:  Family History  Problem Relation Age of Onset   Lung cancer Mother 71     SOCIAL HISTORY:  reports that she has never smoked. She has never used smokeless tobacco. She reports current alcohol use of about 1.0 standard drink of alcohol per week. She reports that she does not use drugs. The patient is married and lives  in [place]. She works as a copy at [employer]. She has a close family that enjoys getting together.    ALLERGIES: Patient has no known allergies.   MEDICATIONS:  Current Outpatient Medications  Medication Sig Dispense Refill   amLODipine  (NORVASC ) 10 MG tablet Take one tablet daily by mouth 90 tablet 1   atorvastatin  (LIPITOR) 10 MG tablet Take 1 tablet (10 mg total) by mouth daily. 90 tablet 3   hydrochlorothiazide  (HYDRODIURIL ) 25 MG tablet Take 1 tablet (25 mg total) by mouth daily. 90 tablet 1   losartan  (COZAAR ) 100 MG tablet Take 1 tablet (100 mg total) by mouth daily. 90 tablet 1   oxyCODONE (ROXICODONE) 5 MG immediate release tablet Take 1 tablet (5 mg total) by mouth every 6 (six) hours as needed. 10 tablet 0   No current facility-administered medications for this visit.     REVIEW OF SYSTEMS: On review of systems, the patient reports that she***     PHYSICAL EXAM:  Wt Readings from Last 3 Encounters:  08/06/24 196 lb (88.9 kg)  07/18/24 195 lb 1.7 oz (88.5 kg)  06/13/24 198 lb 14.4 oz (90.2 kg)   Temp Readings from Last 3 Encounters:  07/18/24 (!) 97.5 F (36.4 C) (Temporal)  06/13/24 98.4 F (36.9 C) (Temporal)  05/10/22 98.5 F (36.9 C) (Oral)   BP Readings from Last 3 Encounters:  08/06/24 131/77  07/18/24 113/68  06/13/24 (!) 142/74   Pulse Readings from Last 3 Encounters:  08/06/24 85  07/18/24 77  06/13/24 (!) 106    In general this is a well appearing African American female in no acute distress. She's alert and oriented x4 and appropriate throughout the examination. Cardiopulmonary assessment is negative for acute distress and she exhibits normal effort.  Her left breast incision site is well-healed without erythema separation or drainage.  ECOG = ***  0 - Asymptomatic (Fully active, able to carry on all predisease activities without restriction)  1 - Symptomatic but completely ambulatory (Restricted in physically strenuous activity  but ambulatory and able to carry out work of a light or sedentary nature. For example, light housework, office work)  2 - Symptomatic, <50% in bed during the day (Ambulatory and capable of all self care but unable to carry out any work activities. Up and about more than 50% of waking hours)  3 - Symptomatic, >50% in bed, but not bedbound (Capable of only limited self-care, confined to bed or chair 50% or more of waking hours)  4 - Bedbound (Completely disabled. Cannot carry on any self-care. Totally confined to bed or chair)  5 - Death   Raylene MM, Creech RH, Tormey DC, et al. [phone]). Toxicity and response criteria of the [facility]. Am. DOROTHA Bridges. Oncol. 5 (6): 649-55    LABORATORY DATA:  Lab Results  Component Value Date   WBC 8.0 06/13/2024   HGB 11.4 (L) 06/13/2024   HCT 33.7 (L) 06/13/2024   MCV 73.9 (L) 06/13/2024   PLT 240 06/13/2024   Lab  Results  Component Value Date   NA 134 (L) 07/13/2024   K 4.0 07/13/2024   CL 102 07/13/2024   CO2 22 07/13/2024   Lab Results  Component Value Date   ALT 9 06/13/2024   AST 13 (L) 06/13/2024   ALKPHOS 74 06/13/2024   BILITOT 0.5 06/13/2024      RADIOGRAPHY: MM Breast Surgical Specimen Result Date: 07/18/2024 CLINICAL DATA:  Status post LEFT breast lumpectomy. EXAM: SPECIMEN RADIOGRAPH OF THE LEFT BREAST COMPARISON:  Previous exam(s). FINDINGS: Status post excision of the left breast. The radioactive seed and coil shaped biopsy marker clip are present, completely intact, and were marked for pathology. IMPRESSION: Specimen radiograph of the left breast. Electronically Signed   By: Aliene Lloyd M.D.   On: 07/18/2024 12:57   NM Sentinel Node Inj-No Rpt (Breast) Result Date: 07/18/2024 Lymphoseek was injected by the Nuclear Medicine Technologist for sentinel lymph node localization.   MM LT RADIOACTIVE SEED LOC MAMMO GUIDE Result Date: 07/17/2024 CLINICAL DATA:  63 year old female presents for radioactive seed  localization of LEFT breast cancer. EXAM: MAMMOGRAPHIC GUIDED RADIOACTIVE SEED LOCALIZATION OF THE LEFT BREAST COMPARISON:  Previous exam(s). FINDINGS: Patient presents for radioactive seed localization prior to LEFT lumpectomy. I met with the patient and we discussed the procedure of seed localization including benefits and alternatives. We discussed the high likelihood of a successful procedure. We discussed the risks of the procedure including infection, bleeding, tissue injury and further surgery. We discussed the low dose of radioactivity involved in the procedure. Informed, written consent was given. The usual time-out protocol was performed immediately prior to the procedure. Using mammographic guidance, sterile technique, 1% lidocaine and an I-125 radioactive seed, the COIL biopsy clip was localized using a MEDIAL approach. The follow-up mammogram images confirm the seed in the expected location and were marked for Dr. Curvin. Follow-up survey of the patient confirms presence of the radioactive seed. Order number of I-125 seed:  [id number]. Total activity:  0.236 millicuries.  Reference Date: 03/22/2024. The patient tolerated the procedure well and was [facility]. She was given instructions regarding seed removal. IMPRESSION: Radioactive seed localization of the LEFT breast. No apparent complications. Electronically Signed   By: Reyes Phi M.D.   On: 07/17/2024 10:06       IMPRESSION/PLAN: 1. Stage IA, pT1bN0M0, grade 2, ER/R positive invasive lobular carcinoma of the left breast. Dr. Dewey has reviewed the pathology findings and the patient and I have discussed the nature of early stage breast disease.  Patient has done well since surgery, and Dr. Odean does not recommend chemotherapy based on her Oncotype DX score.  Dr. Dewey recommends external radiotherapy to the breast  to reduce risks of local recurrence. Dr. Odean anticipates adjuvant antiestrogen therapy to follow. We  discussed the risks, benefits, short, and long term effects of radiotherapy, as well as the curative intent, and the patient is interested in proceeding.  I reviewed the delivery and logistics of radiotherapy and that Dr. Dewey recommends a course of 4 weeks of radiotherapy to the left breast with deep inspiration breath-hold technique. Written consent is obtained and placed in the chart, a copy was provided to the patient. She will simulate on Friday this week.      In a visit lasting *** minutes, greater than 50% of the time was spent face to face discussing the patient's condition, in preparation for the discussion, and coordinating the patient's care.     Donald KYM Husband, PAC    **  Disclaimer: This note was dictated with voice recognition software. Similar sounding words can inadvertently be transcribed and this note may contain transcription errors which may not have been corrected upon publication of note.**Since her last visit, the patient

## 2024-08-16 ENCOUNTER — Ambulatory Visit
Admission: RE | Admit: 2024-08-16 | Discharge: 2024-08-16 | Disposition: A | Discharge status: Home / Self Care | Source: Ambulatory Visit | Attending: Radiation Oncology | Admitting: Radiation Oncology

## 2024-08-16 VITALS — BP 153/72 | HR 94 | Temp 98.2°F | Resp 18 | Ht 67.0 in | Wt 195.4 lb

## 2024-08-16 DIAGNOSIS — I10 Essential (primary) hypertension: Secondary | ICD-10-CM | POA: Insufficient documentation

## 2024-08-16 DIAGNOSIS — C50312 Malignant neoplasm of lower-inner quadrant of left female breast: Secondary | ICD-10-CM | POA: Insufficient documentation

## 2024-08-16 DIAGNOSIS — Z79899 Other long term (current) drug therapy: Secondary | ICD-10-CM | POA: Diagnosis not present

## 2024-08-16 DIAGNOSIS — Z170 Estrogen receptor positive status [ER+]: Secondary | ICD-10-CM | POA: Insufficient documentation

## 2024-08-16 DIAGNOSIS — E785 Hyperlipidemia, unspecified: Secondary | ICD-10-CM | POA: Diagnosis not present

## 2024-08-16 DIAGNOSIS — Z801 Family history of malignant neoplasm of trachea, bronchus and lung: Secondary | ICD-10-CM | POA: Diagnosis not present

## 2024-08-17 ENCOUNTER — Ambulatory Visit
Admission: RE | Admit: 2024-08-17 | Discharge: 2024-08-17 | Disposition: A | Discharge status: Home / Self Care | Source: Ambulatory Visit | Attending: Radiation Oncology | Admitting: Radiation Oncology

## 2024-08-24 ENCOUNTER — Encounter: Payer: Self-pay | Admitting: *Deleted

## 2024-08-24 DIAGNOSIS — C50312 Malignant neoplasm of lower-inner quadrant of left female breast: Secondary | ICD-10-CM

## 2024-09-03 ENCOUNTER — Ambulatory Visit
Admission: RE | Admit: 2024-09-03 | Discharge: 2024-09-03 | Disposition: A | Discharge status: Home / Self Care | Source: Ambulatory Visit | Attending: Radiation Oncology | Admitting: Radiation Oncology

## 2024-09-03 ENCOUNTER — Other Ambulatory Visit: Payer: Self-pay

## 2024-09-03 LAB — RAD ONC ARIA SESSION SUMMARY
Course Elapsed Days: 0
Plan Fractions Treated to Date: 1
Plan Prescribed Dose Per Fraction: 2.66 Gy
Plan Total Fractions Prescribed: 16
Plan Total Prescribed Dose: 42.56 Gy
Reference Point Dosage Given to Date: 2.66 Gy
Reference Point Session Dosage Given: 2.66 Gy
Session Number: 1

## 2024-09-04 ENCOUNTER — Ambulatory Visit
Admission: RE | Admit: 2024-09-04 | Discharge: 2024-09-04 | Disposition: A | Discharge status: Home / Self Care | Source: Ambulatory Visit | Attending: Radiation Oncology | Admitting: Radiation Oncology

## 2024-09-04 ENCOUNTER — Other Ambulatory Visit: Payer: Self-pay

## 2024-09-04 LAB — RAD ONC ARIA SESSION SUMMARY
Course Elapsed Days: 1
Plan Fractions Treated to Date: 2
Plan Prescribed Dose Per Fraction: 2.66 Gy
Plan Total Fractions Prescribed: 16
Plan Total Prescribed Dose: 42.56 Gy
Reference Point Dosage Given to Date: 5.32 Gy
Reference Point Session Dosage Given: 2.66 Gy
Session Number: 2

## 2024-09-05 ENCOUNTER — Other Ambulatory Visit: Payer: Self-pay

## 2024-09-05 ENCOUNTER — Ambulatory Visit
Admission: RE | Admit: 2024-09-05 | Discharge: 2024-09-05 | Disposition: A | Discharge status: Home / Self Care | Source: Ambulatory Visit | Attending: Radiation Oncology | Admitting: Radiation Oncology

## 2024-09-05 LAB — RAD ONC ARIA SESSION SUMMARY
Course Elapsed Days: 2
Plan Fractions Treated to Date: 3
Plan Prescribed Dose Per Fraction: 2.66 Gy
Plan Total Fractions Prescribed: 16
Plan Total Prescribed Dose: 42.56 Gy
Reference Point Dosage Given to Date: 7.98 Gy
Reference Point Session Dosage Given: 2.66 Gy
Session Number: 3

## 2024-09-10 ENCOUNTER — Ambulatory Visit
Admission: RE | Admit: 2024-09-10 | Discharge: 2024-09-10 | Disposition: A | Source: Ambulatory Visit | Attending: Radiation Oncology

## 2024-09-10 ENCOUNTER — Other Ambulatory Visit: Payer: Self-pay

## 2024-09-10 DIAGNOSIS — Z1732 Human epidermal growth factor receptor 2 negative status: Secondary | ICD-10-CM | POA: Insufficient documentation

## 2024-09-10 DIAGNOSIS — C50312 Malignant neoplasm of lower-inner quadrant of left female breast: Secondary | ICD-10-CM | POA: Diagnosis present

## 2024-09-10 DIAGNOSIS — Z510 Encounter for antineoplastic radiation therapy: Secondary | ICD-10-CM | POA: Insufficient documentation

## 2024-09-10 DIAGNOSIS — Z170 Estrogen receptor positive status [ER+]: Secondary | ICD-10-CM | POA: Diagnosis not present

## 2024-09-10 DIAGNOSIS — Z1721 Progesterone receptor positive status: Secondary | ICD-10-CM | POA: Diagnosis not present

## 2024-09-10 LAB — RAD ONC ARIA SESSION SUMMARY
Course Elapsed Days: 7
Plan Fractions Treated to Date: 4
Plan Prescribed Dose Per Fraction: 2.66 Gy
Plan Total Fractions Prescribed: 16
Plan Total Prescribed Dose: 42.56 Gy
Reference Point Dosage Given to Date: 10.64 Gy
Reference Point Session Dosage Given: 2.66 Gy
Session Number: 4

## 2024-09-11 ENCOUNTER — Ambulatory Visit
Admission: RE | Admit: 2024-09-11 | Discharge: 2024-09-11 | Disposition: A | Source: Ambulatory Visit | Attending: Radiation Oncology

## 2024-09-11 ENCOUNTER — Other Ambulatory Visit: Payer: Self-pay

## 2024-09-11 DIAGNOSIS — Z510 Encounter for antineoplastic radiation therapy: Secondary | ICD-10-CM | POA: Diagnosis not present

## 2024-09-11 LAB — RAD ONC ARIA SESSION SUMMARY
Course Elapsed Days: 8
Plan Fractions Treated to Date: 5
Plan Prescribed Dose Per Fraction: 2.66 Gy
Plan Total Fractions Prescribed: 16
Plan Total Prescribed Dose: 42.56 Gy
Reference Point Dosage Given to Date: 13.3 Gy
Reference Point Session Dosage Given: 2.66 Gy
Session Number: 5

## 2024-09-12 ENCOUNTER — Ambulatory Visit
Admission: RE | Admit: 2024-09-12 | Discharge: 2024-09-12 | Disposition: A | Source: Ambulatory Visit | Attending: Radiation Oncology

## 2024-09-12 ENCOUNTER — Other Ambulatory Visit: Payer: Self-pay

## 2024-09-12 DIAGNOSIS — Z510 Encounter for antineoplastic radiation therapy: Secondary | ICD-10-CM | POA: Diagnosis not present

## 2024-09-12 LAB — RAD ONC ARIA SESSION SUMMARY
Course Elapsed Days: 9
Plan Fractions Treated to Date: 6
Plan Prescribed Dose Per Fraction: 2.66 Gy
Plan Total Fractions Prescribed: 16
Plan Total Prescribed Dose: 42.56 Gy
Reference Point Dosage Given to Date: 15.96 Gy
Reference Point Session Dosage Given: 2.66 Gy
Session Number: 6

## 2024-09-13 ENCOUNTER — Ambulatory Visit
Admission: RE | Admit: 2024-09-13 | Discharge: 2024-09-13 | Disposition: A | Source: Ambulatory Visit | Attending: Radiation Oncology

## 2024-09-13 ENCOUNTER — Other Ambulatory Visit: Payer: Self-pay

## 2024-09-13 DIAGNOSIS — Z510 Encounter for antineoplastic radiation therapy: Secondary | ICD-10-CM | POA: Diagnosis not present

## 2024-09-13 LAB — RAD ONC ARIA SESSION SUMMARY
Course Elapsed Days: 10
Plan Fractions Treated to Date: 7
Plan Prescribed Dose Per Fraction: 2.66 Gy
Plan Total Fractions Prescribed: 16
Plan Total Prescribed Dose: 42.56 Gy
Reference Point Dosage Given to Date: 18.62 Gy
Reference Point Session Dosage Given: 2.66 Gy
Session Number: 7

## 2024-09-14 ENCOUNTER — Ambulatory Visit: Admission: RE | Admit: 2024-09-14 | Discharge: 2024-09-14 | Attending: Radiation Oncology

## 2024-09-14 ENCOUNTER — Ambulatory Visit
Admission: RE | Admit: 2024-09-14 | Discharge: 2024-09-14 | Disposition: A | Source: Ambulatory Visit | Attending: Radiation Oncology

## 2024-09-14 ENCOUNTER — Other Ambulatory Visit: Payer: Self-pay

## 2024-09-14 DIAGNOSIS — Z510 Encounter for antineoplastic radiation therapy: Secondary | ICD-10-CM | POA: Diagnosis not present

## 2024-09-14 LAB — RAD ONC ARIA SESSION SUMMARY
Course Elapsed Days: 11
Plan Fractions Treated to Date: 8
Plan Prescribed Dose Per Fraction: 2.66 Gy
Plan Total Fractions Prescribed: 16
Plan Total Prescribed Dose: 42.56 Gy
Reference Point Dosage Given to Date: 21.28 Gy
Reference Point Session Dosage Given: 2.66 Gy
Session Number: 8

## 2024-09-17 ENCOUNTER — Other Ambulatory Visit: Payer: Self-pay

## 2024-09-17 ENCOUNTER — Ambulatory Visit
Admission: RE | Admit: 2024-09-17 | Discharge: 2024-09-17 | Disposition: A | Source: Ambulatory Visit | Attending: Radiation Oncology

## 2024-09-17 DIAGNOSIS — Z510 Encounter for antineoplastic radiation therapy: Secondary | ICD-10-CM | POA: Diagnosis not present

## 2024-09-17 LAB — RAD ONC ARIA SESSION SUMMARY
Course Elapsed Days: 14
Plan Fractions Treated to Date: 9
Plan Prescribed Dose Per Fraction: 2.66 Gy
Plan Total Fractions Prescribed: 16
Plan Total Prescribed Dose: 42.56 Gy
Reference Point Dosage Given to Date: 23.94 Gy
Reference Point Session Dosage Given: 2.66 Gy
Session Number: 9

## 2024-09-18 ENCOUNTER — Other Ambulatory Visit: Payer: Self-pay

## 2024-09-18 ENCOUNTER — Ambulatory Visit
Admission: RE | Admit: 2024-09-18 | Discharge: 2024-09-18 | Disposition: A | Source: Ambulatory Visit | Attending: Radiation Oncology

## 2024-09-18 DIAGNOSIS — Z510 Encounter for antineoplastic radiation therapy: Secondary | ICD-10-CM | POA: Diagnosis not present

## 2024-09-18 LAB — RAD ONC ARIA SESSION SUMMARY
Course Elapsed Days: 15
Plan Fractions Treated to Date: 10
Plan Prescribed Dose Per Fraction: 2.66 Gy
Plan Total Fractions Prescribed: 16
Plan Total Prescribed Dose: 42.56 Gy
Reference Point Dosage Given to Date: 26.6 Gy
Reference Point Session Dosage Given: 2.66 Gy
Session Number: 10

## 2024-09-19 ENCOUNTER — Other Ambulatory Visit: Payer: Self-pay

## 2024-09-19 ENCOUNTER — Ambulatory Visit
Admission: RE | Admit: 2024-09-19 | Discharge: 2024-09-19 | Disposition: A | Discharge status: Home / Self Care | Source: Ambulatory Visit | Attending: Radiation Oncology | Admitting: Radiation Oncology

## 2024-09-19 ENCOUNTER — Ambulatory Visit

## 2024-09-19 DIAGNOSIS — Z510 Encounter for antineoplastic radiation therapy: Secondary | ICD-10-CM | POA: Diagnosis not present

## 2024-09-19 LAB — RAD ONC ARIA SESSION SUMMARY
Course Elapsed Days: 16
Plan Fractions Treated to Date: 11
Plan Prescribed Dose Per Fraction: 2.66 Gy
Plan Total Fractions Prescribed: 16
Plan Total Prescribed Dose: 42.56 Gy
Reference Point Dosage Given to Date: 29.26 Gy
Reference Point Session Dosage Given: 2.66 Gy
Session Number: 11

## 2024-09-20 ENCOUNTER — Ambulatory Visit
Admission: RE | Admit: 2024-09-20 | Discharge: 2024-09-20 | Disposition: A | Discharge status: Home / Self Care | Source: Ambulatory Visit | Attending: Radiation Oncology | Admitting: Radiation Oncology

## 2024-09-20 ENCOUNTER — Other Ambulatory Visit: Payer: Self-pay

## 2024-09-20 ENCOUNTER — Ambulatory Visit

## 2024-09-20 DIAGNOSIS — Z510 Encounter for antineoplastic radiation therapy: Secondary | ICD-10-CM | POA: Diagnosis not present

## 2024-09-20 LAB — RAD ONC ARIA SESSION SUMMARY
Course Elapsed Days: 17
Plan Fractions Treated to Date: 12
Plan Prescribed Dose Per Fraction: 2.66 Gy
Plan Total Fractions Prescribed: 16
Plan Total Prescribed Dose: 42.56 Gy
Reference Point Dosage Given to Date: 31.92 Gy
Reference Point Session Dosage Given: 2.66 Gy
Session Number: 12

## 2024-09-21 ENCOUNTER — Ambulatory Visit
Admission: RE | Admit: 2024-09-21 | Discharge: 2024-09-21 | Disposition: A | Source: Ambulatory Visit | Attending: Radiation Oncology

## 2024-09-21 ENCOUNTER — Ambulatory Visit: Admitting: Radiation Oncology

## 2024-09-21 ENCOUNTER — Other Ambulatory Visit: Payer: Self-pay

## 2024-09-21 DIAGNOSIS — Z510 Encounter for antineoplastic radiation therapy: Secondary | ICD-10-CM | POA: Diagnosis not present

## 2024-09-21 LAB — RAD ONC ARIA SESSION SUMMARY
Course Elapsed Days: 18
Plan Fractions Treated to Date: 13
Plan Prescribed Dose Per Fraction: 2.66 Gy
Plan Total Fractions Prescribed: 16
Plan Total Prescribed Dose: 42.56 Gy
Reference Point Dosage Given to Date: 34.58 Gy
Reference Point Session Dosage Given: 2.66 Gy
Session Number: 13

## 2024-09-24 ENCOUNTER — Ambulatory Visit
Admission: RE | Admit: 2024-09-24 | Discharge: 2024-09-24 | Disposition: A | Source: Ambulatory Visit | Attending: Radiation Oncology

## 2024-09-24 ENCOUNTER — Other Ambulatory Visit: Payer: Self-pay

## 2024-09-24 DIAGNOSIS — Z510 Encounter for antineoplastic radiation therapy: Secondary | ICD-10-CM | POA: Diagnosis not present

## 2024-09-24 LAB — RAD ONC ARIA SESSION SUMMARY
Course Elapsed Days: 21
Plan Fractions Treated to Date: 14
Plan Prescribed Dose Per Fraction: 2.66 Gy
Plan Total Fractions Prescribed: 16
Plan Total Prescribed Dose: 42.56 Gy
Reference Point Dosage Given to Date: 37.24 Gy
Reference Point Session Dosage Given: 2.66 Gy
Session Number: 14

## 2024-09-25 ENCOUNTER — Ambulatory Visit

## 2024-09-26 ENCOUNTER — Ambulatory Visit: Admission: RE | Admit: 2024-09-26 | Discharge: 2024-09-26 | Attending: Radiation Oncology

## 2024-09-26 ENCOUNTER — Other Ambulatory Visit: Payer: Self-pay

## 2024-09-26 DIAGNOSIS — Z510 Encounter for antineoplastic radiation therapy: Secondary | ICD-10-CM | POA: Diagnosis not present

## 2024-09-26 LAB — RAD ONC ARIA SESSION SUMMARY
Course Elapsed Days: 23
Plan Fractions Treated to Date: 15
Plan Prescribed Dose Per Fraction: 2.66 Gy
Plan Total Fractions Prescribed: 16
Plan Total Prescribed Dose: 42.56 Gy
Reference Point Dosage Given to Date: 39.9 Gy
Reference Point Session Dosage Given: 2.66 Gy
Session Number: 15

## 2024-09-27 ENCOUNTER — Other Ambulatory Visit: Payer: Self-pay

## 2024-09-27 ENCOUNTER — Ambulatory Visit: Admission: RE | Admit: 2024-09-27

## 2024-09-27 DIAGNOSIS — Z510 Encounter for antineoplastic radiation therapy: Secondary | ICD-10-CM | POA: Diagnosis not present

## 2024-09-27 LAB — RAD ONC ARIA SESSION SUMMARY
Course Elapsed Days: 24
Plan Fractions Treated to Date: 16
Plan Prescribed Dose Per Fraction: 2.66 Gy
Plan Total Fractions Prescribed: 16
Plan Total Prescribed Dose: 42.56 Gy
Reference Point Dosage Given to Date: 42.56 Gy
Reference Point Session Dosage Given: 2.66 Gy
Session Number: 16

## 2024-09-28 ENCOUNTER — Ambulatory Visit: Admission: RE | Admit: 2024-09-28 | Discharge: 2024-09-28 | Attending: Radiation Oncology

## 2024-09-28 ENCOUNTER — Other Ambulatory Visit: Payer: Self-pay

## 2024-09-28 DIAGNOSIS — Z510 Encounter for antineoplastic radiation therapy: Secondary | ICD-10-CM | POA: Diagnosis not present

## 2024-09-28 LAB — RAD ONC ARIA SESSION SUMMARY
Course Elapsed Days: 25
Plan Fractions Treated to Date: 1
Plan Prescribed Dose Per Fraction: 2 Gy
Plan Total Fractions Prescribed: 4
Plan Total Prescribed Dose: 8 Gy
Reference Point Dosage Given to Date: 2 Gy
Reference Point Session Dosage Given: 2 Gy
Session Number: 17

## 2024-10-01 ENCOUNTER — Other Ambulatory Visit: Payer: Self-pay

## 2024-10-01 ENCOUNTER — Ambulatory Visit: Admission: RE | Admit: 2024-10-01 | Discharge: 2024-10-01 | Attending: Radiation Oncology

## 2024-10-01 DIAGNOSIS — Z510 Encounter for antineoplastic radiation therapy: Secondary | ICD-10-CM | POA: Diagnosis not present

## 2024-10-01 LAB — RAD ONC ARIA SESSION SUMMARY
Course Elapsed Days: 28
Plan Fractions Treated to Date: 2
Plan Prescribed Dose Per Fraction: 2 Gy
Plan Total Fractions Prescribed: 4
Plan Total Prescribed Dose: 8 Gy
Reference Point Dosage Given to Date: 4 Gy
Reference Point Session Dosage Given: 2 Gy
Session Number: 18

## 2024-10-02 ENCOUNTER — Ambulatory Visit
Admission: RE | Admit: 2024-10-02 | Discharge: 2024-10-02 | Disposition: A | Discharge status: Home / Self Care | Source: Ambulatory Visit | Attending: Radiation Oncology | Admitting: Radiation Oncology

## 2024-10-02 ENCOUNTER — Other Ambulatory Visit: Payer: Self-pay

## 2024-10-02 ENCOUNTER — Inpatient Hospital Stay: Attending: Hematology and Oncology | Admitting: Hematology and Oncology

## 2024-10-02 VITALS — BP 142/72 | HR 87 | Temp 98.2°F | Resp 18 | Ht 67.0 in | Wt 195.0 lb

## 2024-10-02 DIAGNOSIS — Z79811 Long term (current) use of aromatase inhibitors: Secondary | ICD-10-CM | POA: Insufficient documentation

## 2024-10-02 DIAGNOSIS — Z510 Encounter for antineoplastic radiation therapy: Secondary | ICD-10-CM | POA: Diagnosis not present

## 2024-10-02 DIAGNOSIS — Z923 Personal history of irradiation: Secondary | ICD-10-CM | POA: Insufficient documentation

## 2024-10-02 DIAGNOSIS — C50312 Malignant neoplasm of lower-inner quadrant of left female breast: Secondary | ICD-10-CM | POA: Insufficient documentation

## 2024-10-02 DIAGNOSIS — Z170 Estrogen receptor positive status [ER+]: Secondary | ICD-10-CM | POA: Insufficient documentation

## 2024-10-02 LAB — RAD ONC ARIA SESSION SUMMARY
Course Elapsed Days: 29
Plan Fractions Treated to Date: 3
Plan Prescribed Dose Per Fraction: 2 Gy
Plan Total Fractions Prescribed: 4
Plan Total Prescribed Dose: 8 Gy
Reference Point Dosage Given to Date: 6 Gy
Reference Point Session Dosage Given: 2 Gy
Session Number: 19

## 2024-10-02 MED ORDER — ANASTROZOLE 1 MG PO TABS: 1.0000 mg | ORAL_TABLET | Freq: Every day | ORAL | 3 refills | Status: AC

## 2024-10-02 NOTE — Progress Notes (Signed)
 "  Patient Care Team: Celestia Rosaline SQUIBB, NP as PCP - General (Internal Medicine) Tyree Nanetta SAILOR, RN as Oncology Nurse Navigator Gerome, Devere HERO, RN as Oncology Nurse Navigator Curvin Deward MOULD, MD as Consulting Physician (General Surgery) Odean Potts, MD as Consulting Physician (Hematology and Oncology) Dewey Rush, MD as Consulting Physician (Radiation Oncology)  DIAGNOSIS:  Encounter Diagnosis  Name Primary?   Malignant neoplasm of lower-inner quadrant of left breast in female, estrogen receptor positive (HCC) Yes    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%, HER2 negative, axilla negative   06/13/2024 Cancer Staging   Staging form: Breast, AJCC 8th Edition - Clinical stage from 06/13/2024: Stage IA (cT1b, cN0, cM0, G2, ER+, PR+, HER2-) - Signed by Lanell Donald Stagger, PA-C on 06/13/2024 Stage prefix: Initial diagnosis Method of lymph node assessment: Clinical Histologic grading system: 3 grade system   06/22/2024 Genetic Testing   Negative genetic testing on the CancerNext-Expanded+RNAinsight panel.  The report date is June 22, 2024.  The CancerNext-Expanded gene panel offered by [facility] and includes sequencing, rearrangement, and RNA analysis for the following 77 genes: AIP, ALK, APC, ATM, BAP1, BARD1, BMPR1A, BRCA1, BRCA2, BRIP1, CDC73, CDH1, CDK4, CDKN1B, CDKN2A, CEBPA, CHEK2, CTNNA1, DDX41, DICER1, ETV6, FH, FLCN, GATA2, LZTR1, MAX, MBD4, MEN1, MET, MLH1, MSH2, MSH3, MSH6, MUTYH, NF1, NF2, NTHL1, PALB2, PHOX2B, PMS2, POT1, PRKAR1A, PTCH1, PTEN, RAD51C, RAD51D, RB1, RET, RPS20, RUNX1, SDHA, SDHAF2, SDHB, SDHC, SDHD, SMAD4, SMARCA4, SMARCB1, SMARCE1,  STK11, SUFU, TMEM127, TP53, TSC1, TSC2, VHL, and WT1 (sequencing and deletion/duplication); AXIN2, CTNNA1, DDX41, EGFR, HOXB13, KIT, MBD4, MITF, MSH3, PDGFRA, POLD1 and POLE (sequencing only); EPCAM and GREM1 (deletion/duplication only). RNA data is routinely analyzed for use in variant interpretation for all genes.    07/18/2024 Surgery   Left lumpectomy: Grade 2 ILC with extensive LCIS 0.8 cm, margins negative, 0/6 lymph nodes negative, ER 95%, PR 100%, HER2 negative by FISH, Ki-67 5%   08/13/2024 Cancer Staging   Staging form: Breast, AJCC 8th Edition - Pathologic: Stage IA (pT1b, pN0(sn), cM0, G2, ER+, PR+, HER2-, Oncotype DX score: 10) - Signed by Lanell Donald Stagger, PA-C on 08/13/2024 Stage prefix: Initial diagnosis Method of lymph node assessment: Sentinel lymph node biopsy Multigene prognostic tests performed: Oncotype DX Recurrence score range: Less than 11 Histologic grading system: 3 grade system     CHIEF COMPLIANT: Follow-up at the end of radiation  HISTORY OF PRESENT ILLNESS:   History of Present Illness Rebecca Huber is a 63 year old female with stage IA, ER/PR positive, 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  She has completed breast-conserving surgery and adjuvant radiation therapy for invasive lobular carcinoma of the left lower-inner quadrant.  She is preparing to start anastrozole  1 mg daily for five years beginning January 1st. She was advised it can be taken at any time, including with other medications.  Potential side effects of anastrozole  including vasomotor symptoms, arthralgia, and osteoporosis risk were reviewed. She currently denies joint pain or stiffness. She understands the need for regular bone density monitoring and calcium  and vitamin D supplementation and agrees to maintain regular ambulation and exercise.  ALLERGIES:  has no  known allergies.  MEDICATIONS:  Current Outpatient Medications  Medication Sig Dispense Refill   amLODipine  (NORVASC ) 10 MG tablet Take one tablet daily by mouth 90 tablet 1   atorvastatin  (LIPITOR) 10 MG tablet Take 1 tablet (10 mg total) by mouth daily. 90 tablet 3   hydrochlorothiazide  (HYDRODIURIL ) 25 MG tablet Take 1 tablet (25 mg total) by mouth daily. 90 tablet 1   losartan  (COZAAR ) 100 MG tablet Take 1 tablet (100 mg total) by mouth daily. 90 tablet 1   oxyCODONE  (ROXICODONE ) 5 MG immediate release tablet Take 1 tablet (5 mg total) by mouth every 6 (six) hours as needed. 10 tablet 0   No current facility-administered medications for this visit.    PHYSICAL EXAMINATION: ECOG PERFORMANCE STATUS: 1 - Symptomatic but completely ambulatory  Vitals:   10/02/24 1452  BP: (!) 142/72  Pulse: 87  Resp: 18  Temp: 98.2 F (36.8 C)  SpO2: 100%   Filed Weights   10/02/24 1452  Weight: 195 lb (88.5 kg)      LABORATORY DATA:  I have reviewed the data as listed    Latest Ref Rng & Units 07/13/2024   12:40 PM 07/12/2024    3:00 PM 06/13/2024   12:16 PM  CMP  Glucose 70 - 99 mg/dL 830  844  825   BUN 8 - 23 mg/dL 48  36  31   Creatinine 0.44 - 1.00 mg/dL 8.06  7.72  8.67   Sodium 135 - 145 mmol/L 134  136  139   Potassium 3.5 - 5.1 mmol/L 4.0  3.2  3.2   Chloride 98 - 111 mmol/L 102  105  106   CO2 22 - 32 mmol/L 22  20  22    Calcium  8.9 - 10.3 mg/dL 8.9  9.0  9.1   Total Protein 6.5 - 8.1 g/dL   7.5   Total Bilirubin 0.0 - 1.2 mg/dL   0.5   Alkaline Phos 38 - 126 U/L   74   AST 15 - 41 U/L   13   ALT 0 - 44 U/L   9     Lab Results  Component Value Date   WBC 8.0 06/13/2024   HGB 11.4 (L) 06/13/2024   HCT 33.7 (L) 06/13/2024   MCV 73.9 (L) 06/13/2024   PLT 240 06/13/2024   NEUTROABS 4.4 06/13/2024    ASSESSMENT & PLAN:  Malignant neoplasm of lower-inner quadrant of left breast in female, estrogen receptor positive (HCC) 06/01/2024:Screening mammogram detected right  breast asymmetry: Benign, left breast asymmetry and distortion by ultrasound 0.7 cm mass, biopsy: Grade 2 ILC with LCIS ER 95% PR 100% Ki67 5%, HER2 negative, axilla negative   Treatment plan: 1.  07/18/2024: Left lumpectomy: Grade 2 ILC with extensive LCIS 0.8 cm, margins negative, 0/6 lymph nodes negative, ER 95%, PR 100%, HER2 negative, Ki-67 5% 2. Oncotype DX score: 10 (distant recurrence rate 5%) 3. Adjuvant radiation therapy 09/04/2024-10/03/2024 4. Adjuvant antiestrogen therapy started 10/11/2024 ---------------------------------------------------------------- Anastrozole  counseling: We discussed the risks and benefits of anti-estrogen therapy with aromatase inhibitors. These include but not limited to insomnia, hot flashes, mood changes, vaginal dryness, bone density loss, and weight gain. We strongly believe that the benefits far outweigh the risks. Patient understands these risks and consented to starting treatment. Planned treatment duration is 5-7 years.  Return to clinic in 3 months for survivorship care plan visit     No orders of the defined types were placed  in this encounter.  The patient has a good understanding of the overall plan. she agrees with it. she will call with any problems that may develop before the next visit here.  I personally spent a total of 30 minutes in the care of the patient today including preparing to see the patient, getting/reviewing separately obtained history, performing a medically appropriate exam/evaluation, counseling and educating, placing orders, referring and communicating with other health care professionals, documenting clinical information in the EHR, independently interpreting results, communicating results, and coordinating care.   Viinay K Balin Vandegrift, MD 10/02/2024    "

## 2024-10-02 NOTE — Assessment & Plan Note (Signed)
 06/01/2024:Screening mammogram detected right breast asymmetry: Benign, left breast asymmetry and distortion by ultrasound 0.7 cm mass, biopsy: Grade 2 ILC with LCIS ER 95% PR 100% Ki67 5%, HER2 negative, axilla negative   Treatment plan: 1.  07/18/2024: Left lumpectomy: Grade 2 ILC with extensive LCIS 0.8 cm, margins negative, 0/6 lymph nodes negative, ER 95%, PR 100%, HER2 negative, Ki-67 5% 2. Oncotype DX score: 10 (distant recurrence rate 5%) 3. Adjuvant radiation therapy 09/04/2024-10/03/2024 4. Adjuvant antiestrogen therapy started 10/11/2024 ---------------------------------------------------------------- Anastrozole  counseling: We discussed the risks and benefits of anti-estrogen therapy with aromatase inhibitors. These include but not limited to insomnia, hot flashes, mood changes, vaginal dryness, bone density loss, and weight gain. We strongly believe that the benefits far outweigh the risks. Patient understands these risks and consented to starting treatment. Planned treatment duration is 7 years.  Return to clinic in 3 months for survivorship care plan visit

## 2024-10-03 ENCOUNTER — Other Ambulatory Visit: Payer: Self-pay

## 2024-10-03 ENCOUNTER — Ambulatory Visit
Admission: RE | Admit: 2024-10-03 | Discharge: 2024-10-03 | Disposition: A | Source: Ambulatory Visit | Attending: Radiation Oncology

## 2024-10-03 ENCOUNTER — Other Ambulatory Visit (INDEPENDENT_AMBULATORY_CARE_PROVIDER_SITE_OTHER): Payer: Self-pay | Admitting: Primary Care

## 2024-10-03 ENCOUNTER — Ambulatory Visit

## 2024-10-03 DIAGNOSIS — Z760 Encounter for issue of repeat prescription: Secondary | ICD-10-CM

## 2024-10-03 DIAGNOSIS — I10 Essential (primary) hypertension: Secondary | ICD-10-CM

## 2024-10-03 DIAGNOSIS — Z510 Encounter for antineoplastic radiation therapy: Secondary | ICD-10-CM | POA: Diagnosis not present

## 2024-10-03 LAB — RAD ONC ARIA SESSION SUMMARY
Course Elapsed Days: 30
Plan Fractions Treated to Date: 4
Plan Prescribed Dose Per Fraction: 2 Gy
Plan Total Fractions Prescribed: 4
Plan Total Prescribed Dose: 8 Gy
Reference Point Dosage Given to Date: 8 Gy
Reference Point Session Dosage Given: 2 Gy
Session Number: 20

## 2024-10-05 NOTE — Telephone Encounter (Signed)
 Requested Prescriptions  Pending Prescriptions Disp Refills   amLODipine  (NORVASC ) 10 MG tablet [Pharmacy Med Name: AMLODIPINE  BESYLATE 10 MG TAB] 90 tablet 0    Sig: TAKE 1 TABLET BY MOUTH EVERY DAY     Cardiovascular: Calcium  Channel Blockers 2 Failed - 10/05/2024  2:39 PM      Failed - Last BP in normal range    BP Readings from Last 1 Encounters:  10/02/24 (!) 142/72         Passed - Last Heart Rate in normal range    Pulse Readings from Last 1 Encounters:  10/02/24 87         Passed - Valid encounter within last 6 months    Recent Outpatient Visits           2 months ago Cervical cancer screening   [place] [facility] Celestia Rosaline SQUIBB, NP   8 months ago Screening mammogram for breast cancer   [place] [facility] Celestia Rosaline SQUIBB, NP   1 year ago Elevated LDL cholesterol level   [place] [facility] Celestia Rosaline SQUIBB, NP   1 year ago Syncope, unspecified syncope type   [place] [facility] Celestia Rosaline SQUIBB, NP   1 year ago Colon cancer screening   [place] [facility] Celestia Rosaline SQUIBB, NP

## 2024-10-08 NOTE — Radiation Completion Notes (Addendum)
" °  Radiation Oncology         [phone] ________________________________  Name: Rebecca Huber MRN: [id number]  Date of Service: 10/03/2024  DOB: 1961/01/25  End of Treatment Note  Diagnosis: Stage IA, pT1bN0M0, grade 2, ER/R positive invasive lobular carcinoma of the left breast.   Intent: Curative     ==========DELIVERED PLANS==========  First Treatment Date: 2024-09-03 Last Treatment Date: 2024-10-03   Plan Name: Breast_L Site: Breast, Left Technique: 3D Mode: Photon Dose Per Fraction: 2.66 Gy Prescribed Dose (Delivered / Prescribed): 42.56 Gy / 42.56 Gy Prescribed Fxs (Delivered / Prescribed): 16 / 16   Plan Name: Breast_L_Bst Site: Breast, Left Technique: 3D Mode: Photon Dose Per Fraction: 2 Gy Prescribed Dose (Delivered / Prescribed): 8 Gy / 8 Gy Prescribed Fxs (Delivered / Prescribed): 4 / 4     ==========ON TREATMENT VISIT DATES========== 2024-09-05, 2024-09-14, 2024-09-21, 2024-09-28      See weekly On Treatment Notes in Epic for details in the Media tab (listed as Progress notes on the On Treatment Visit Dates listed above). The patient tolerated radiation. She did not complain of discomfort with her anticipate skin changes or complaints of fatigue.  The patient will receive a call in about one month from the radiation oncology department. She will continue follow up with Dr. Gudena as well.      Donald KYM Husband, PAC     "

## 2024-10-15 ENCOUNTER — Ambulatory Visit

## 2024-10-22 ENCOUNTER — Ambulatory Visit: Attending: General Surgery

## 2024-10-22 VITALS — Wt 197.1 lb

## 2024-10-22 DIAGNOSIS — Z483 Aftercare following surgery for neoplasm: Secondary | ICD-10-CM | POA: Insufficient documentation

## 2024-10-22 NOTE — Therapy (Signed)
" °  OUTPATIENT PHYSICAL THERAPY SOZO SCREENING NOTE   Patient Name: Rebecca Huber MRN: [id number] DOB:[birth year], 64 y.o., female Today's Date: 10/22/2024  PCP: Celestia Rosaline SQUIBB, NP REFERRING PROVIDER: Curvin Deward MOULD, MD   PT End of Session - 10/22/24 1055     Visit Number 2   # unchanged due to screen only   PT Start Time 1053    PT Stop Time 1057    PT Time Calculation (min) 4 min    Activity Tolerance Patient tolerated treatment well    Behavior During Therapy [facility] for tasks assessed/performed          Past Medical History:  Diagnosis Date   Cancer (HCC) 06/2024   right breast ILC   Hyperlipidemia    Hypertension    Past Surgical History:  Procedure Laterality Date   BREAST BIOPSY Left 06/01/2024   US  LT BREAST BX W LOC DEV 1ST LESION IMG BX SPEC US  GUIDE 06/01/2024 [facility] MAMMOGRAPHY   BREAST BIOPSY  07/17/2024   MM LT RADIOACTIVE SEED LOC MAMMO GUIDE 07/17/2024 [facility] MAMMOGRAPHY   BREAST LUMPECTOMY WITH RADIOACTIVE SEED AND SENTINEL LYMPH NODE BIOPSY Left 07/18/2024   Procedure: BREAST LUMPECTOMY WITH RADIOACTIVE SEED AND SENTINEL LYMPH NODE BIOPSY;  Surgeon: Curvin Deward MOULD, MD;  Location: [facility];  Service: General;  Laterality: Left;  GEN w/PEC BLOCK LEFT BREAST RADIOACTIVE SEED LOCALIZED LUMPECTOMY SENTINEL NODE BIOPSY   Patient Active Problem List   Diagnosis Date Noted   Genetic testing 06/25/2024   Malignant neoplasm of lower-inner quadrant of left breast in female, estrogen receptor positive (HCC) 06/08/2024   Salmonella enteritis 09/07/2019   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 She underwent a left lumpectomy and sentinel node biopsy (6 negative nodes)  on 07/18/2024. It is ER/PR positive and HER2 negative with a Ki67 of 5%.  PRECAUTIONS: left UE Lymphedema risk, None  SUBJECTIVE: Pt returns for her first 3 month L-Dex screen since surgery.   PAIN:  Are you having pain? No  SOZO SCREENING: Patient was assessed today using the SOZO machine to determine the lymphedema index score. This was compared to her baseline score. It was determined that she is within the recommended range when compared to her baseline and no further action is needed at this time. She will continue SOZO screenings. These are done every 3 months for 2 years post operatively followed by every 6 months for 2 years, and then annually.   L-DEX FLOWSHEETS - 10/22/24 1000       L-DEX LYMPHEDEMA SCREENING   Measurement Type Unilateral    L-DEX MEASUREMENT EXTREMITY Upper Extremity    POSITION  Standing    DOMINANT SIDE Left    At Risk Side Left    BASELINE SCORE (UNILATERAL) 3.4    L-DEX SCORE (UNILATERAL) 2    VALUE CHANGE (UNILAT) -1.4         P: Cont every 3 month L-Dex screens until 07/2026, then transition to 6 months until 07/2028.    Aden Berwyn Caldron, PTA 10/22/2024, 10:57 AM     "

## 2024-10-29 NOTE — Progress Notes (Signed)
" °  Radiation Oncology         [phone] ________________________________  Name: Rebecca Huber MRN: [id number]  Date of Service: 11/05/2024  DOB: Jun 04, 1961  Post Treatment Telephone Note  Diagnosis:  Stage IA, pT1bN0M0, grade 2, ER/R positive invasive lobular carcinoma of the left breast.   First Treatment Date: 2024-09-03 Last Treatment Date: 2024-10-03   Plan Name: Breast_L Site: Breast, Left Technique: 3D Mode: Photon Dose Per Fraction: 2.66 Gy Prescribed Dose (Delivered / Prescribed): 42.56 Gy / 42.56 Gy Prescribed Fxs (Delivered / Prescribed): 16 / 16   Plan Name: Breast_L_Bst Site: Breast, Left Technique: 3D Mode: Photon Dose Per Fraction: 2 Gy Prescribed Dose (Delivered / Prescribed): 8 Gy / 8 Gy Prescribed Fxs (Delivered / Prescribed): 4 / 4     The patient was not available for call today. Left message to return my call from 11/02/24.  "

## 2024-11-05 ENCOUNTER — Ambulatory Visit
Admission: RE | Admit: 2024-11-05 | Discharge: 2024-11-05 | Disposition: A | Discharge status: Home / Self Care | Source: Ambulatory Visit | Attending: Radiation Oncology | Admitting: Radiation Oncology

## 2024-11-05 DIAGNOSIS — C50312 Malignant neoplasm of lower-inner quadrant of left female breast: Secondary | ICD-10-CM

## 2025-01-01 ENCOUNTER — Inpatient Hospital Stay

## 2025-01-01 ENCOUNTER — Inpatient Hospital Stay: Admitting: Adult Health

## 2025-01-21 ENCOUNTER — Ambulatory Visit: Attending: General Surgery

## 2025-02-04 ENCOUNTER — Ambulatory Visit (INDEPENDENT_AMBULATORY_CARE_PROVIDER_SITE_OTHER): Admitting: Primary Care
